# Patient Record
Sex: Female | Born: 1940 | Race: White | Hispanic: No | Smoking: Never smoker
Health system: Southern US, Community
[De-identification: ages and names within clinical notes are randomized; demographics above are authoritative.]

## PROBLEM LIST (undated history)

## (undated) DIAGNOSIS — Z90710 Acquired absence of both cervix and uterus: Secondary | ICD-10-CM

## (undated) HISTORY — PX: OTHER SURGICAL HISTORY: SHX169

## (undated) HISTORY — DX: Acquired absence of both cervix and uterus: Z90.710

---

## 2016-11-24 DIAGNOSIS — J029 Acute pharyngitis, unspecified: Secondary | ICD-10-CM | POA: Diagnosis not present

## 2016-11-24 DIAGNOSIS — R51 Headache: Secondary | ICD-10-CM | POA: Diagnosis not present

## 2016-11-24 DIAGNOSIS — R05 Cough: Secondary | ICD-10-CM | POA: Diagnosis not present

## 2016-11-24 DIAGNOSIS — R0981 Nasal congestion: Secondary | ICD-10-CM | POA: Diagnosis not present

## 2016-11-24 DIAGNOSIS — J069 Acute upper respiratory infection, unspecified: Secondary | ICD-10-CM | POA: Diagnosis not present

## 2016-11-24 DIAGNOSIS — Z6824 Body mass index (BMI) 24.0-24.9, adult: Secondary | ICD-10-CM | POA: Diagnosis not present

## 2016-12-28 DIAGNOSIS — I10 Essential (primary) hypertension: Secondary | ICD-10-CM | POA: Diagnosis not present

## 2016-12-28 DIAGNOSIS — E039 Hypothyroidism, unspecified: Secondary | ICD-10-CM | POA: Diagnosis not present

## 2017-02-11 DIAGNOSIS — M545 Low back pain: Secondary | ICD-10-CM | POA: Diagnosis not present

## 2017-02-11 DIAGNOSIS — Z6824 Body mass index (BMI) 24.0-24.9, adult: Secondary | ICD-10-CM | POA: Diagnosis not present

## 2017-02-11 DIAGNOSIS — K5900 Constipation, unspecified: Secondary | ICD-10-CM | POA: Diagnosis not present

## 2017-02-11 DIAGNOSIS — E039 Hypothyroidism, unspecified: Secondary | ICD-10-CM | POA: Diagnosis not present

## 2017-03-19 ENCOUNTER — Other Ambulatory Visit: Payer: Self-pay | Admitting: Family Medicine

## 2017-03-19 DIAGNOSIS — Z6824 Body mass index (BMI) 24.0-24.9, adult: Secondary | ICD-10-CM | POA: Diagnosis not present

## 2017-03-19 DIAGNOSIS — Z1231 Encounter for screening mammogram for malignant neoplasm of breast: Secondary | ICD-10-CM

## 2017-03-19 DIAGNOSIS — M25561 Pain in right knee: Secondary | ICD-10-CM | POA: Diagnosis not present

## 2017-03-19 DIAGNOSIS — M545 Low back pain: Secondary | ICD-10-CM | POA: Diagnosis not present

## 2017-03-19 DIAGNOSIS — M25562 Pain in left knee: Secondary | ICD-10-CM | POA: Diagnosis not present

## 2017-03-25 DIAGNOSIS — T63481A Toxic effect of venom of other arthropod, accidental (unintentional), initial encounter: Secondary | ICD-10-CM | POA: Diagnosis not present

## 2017-04-08 ENCOUNTER — Ambulatory Visit: Payer: Self-pay

## 2017-04-08 ENCOUNTER — Ambulatory Visit
Admission: RE | Admit: 2017-04-08 | Discharge: 2017-04-08 | Disposition: A | Discharge status: Home / Self Care | Payer: PPO | Source: Ambulatory Visit | Attending: Family Medicine | Admitting: Family Medicine

## 2017-04-08 DIAGNOSIS — Z1231 Encounter for screening mammogram for malignant neoplasm of breast: Secondary | ICD-10-CM

## 2017-04-09 ENCOUNTER — Ambulatory Visit: Payer: Self-pay

## 2017-06-02 DIAGNOSIS — M1712 Unilateral primary osteoarthritis, left knee: Secondary | ICD-10-CM | POA: Diagnosis not present

## 2017-06-09 DIAGNOSIS — M1712 Unilateral primary osteoarthritis, left knee: Secondary | ICD-10-CM | POA: Diagnosis not present

## 2017-06-16 DIAGNOSIS — M1712 Unilateral primary osteoarthritis, left knee: Secondary | ICD-10-CM | POA: Diagnosis not present

## 2017-07-27 DIAGNOSIS — Z961 Presence of intraocular lens: Secondary | ICD-10-CM | POA: Diagnosis not present

## 2017-07-27 DIAGNOSIS — H52203 Unspecified astigmatism, bilateral: Secondary | ICD-10-CM | POA: Diagnosis not present

## 2017-07-27 DIAGNOSIS — H35373 Puckering of macula, bilateral: Secondary | ICD-10-CM | POA: Diagnosis not present

## 2017-08-10 DIAGNOSIS — E785 Hyperlipidemia, unspecified: Secondary | ICD-10-CM | POA: Diagnosis not present

## 2017-08-12 DIAGNOSIS — E782 Mixed hyperlipidemia: Secondary | ICD-10-CM | POA: Diagnosis not present

## 2017-08-12 DIAGNOSIS — Z1211 Encounter for screening for malignant neoplasm of colon: Secondary | ICD-10-CM | POA: Diagnosis not present

## 2017-08-12 DIAGNOSIS — E039 Hypothyroidism, unspecified: Secondary | ICD-10-CM | POA: Diagnosis not present

## 2017-08-12 DIAGNOSIS — Z23 Encounter for immunization: Secondary | ICD-10-CM | POA: Diagnosis not present

## 2017-08-12 DIAGNOSIS — Z6824 Body mass index (BMI) 24.0-24.9, adult: Secondary | ICD-10-CM | POA: Diagnosis not present

## 2017-08-12 DIAGNOSIS — K5900 Constipation, unspecified: Secondary | ICD-10-CM | POA: Diagnosis not present

## 2017-08-12 DIAGNOSIS — Z0000 Encounter for general adult medical examination without abnormal findings: Secondary | ICD-10-CM | POA: Diagnosis not present

## 2017-12-07 DIAGNOSIS — E782 Mixed hyperlipidemia: Secondary | ICD-10-CM | POA: Diagnosis not present

## 2017-12-07 DIAGNOSIS — E039 Hypothyroidism, unspecified: Secondary | ICD-10-CM | POA: Diagnosis not present

## 2017-12-07 DIAGNOSIS — E559 Vitamin D deficiency, unspecified: Secondary | ICD-10-CM | POA: Diagnosis not present

## 2017-12-09 DIAGNOSIS — K5900 Constipation, unspecified: Secondary | ICD-10-CM | POA: Diagnosis not present

## 2017-12-09 DIAGNOSIS — E039 Hypothyroidism, unspecified: Secondary | ICD-10-CM | POA: Diagnosis not present

## 2017-12-09 DIAGNOSIS — E559 Vitamin D deficiency, unspecified: Secondary | ICD-10-CM | POA: Diagnosis not present

## 2017-12-09 DIAGNOSIS — E782 Mixed hyperlipidemia: Secondary | ICD-10-CM | POA: Diagnosis not present

## 2018-01-31 DIAGNOSIS — M545 Low back pain: Secondary | ICD-10-CM | POA: Diagnosis not present

## 2018-01-31 DIAGNOSIS — M25562 Pain in left knee: Secondary | ICD-10-CM | POA: Diagnosis not present

## 2018-01-31 DIAGNOSIS — M1711 Unilateral primary osteoarthritis, right knee: Secondary | ICD-10-CM | POA: Diagnosis not present

## 2018-02-14 DIAGNOSIS — M170 Bilateral primary osteoarthritis of knee: Secondary | ICD-10-CM | POA: Diagnosis not present

## 2018-02-21 DIAGNOSIS — M170 Bilateral primary osteoarthritis of knee: Secondary | ICD-10-CM | POA: Diagnosis not present

## 2018-02-28 DIAGNOSIS — M170 Bilateral primary osteoarthritis of knee: Secondary | ICD-10-CM | POA: Diagnosis not present

## 2018-03-08 ENCOUNTER — Other Ambulatory Visit: Payer: Self-pay | Admitting: Family Medicine

## 2018-03-08 DIAGNOSIS — Z1231 Encounter for screening mammogram for malignant neoplasm of breast: Secondary | ICD-10-CM

## 2018-04-18 ENCOUNTER — Ambulatory Visit
Admission: RE | Admit: 2018-04-18 | Discharge: 2018-04-18 | Disposition: A | Discharge status: Home / Self Care | Payer: PPO | Source: Ambulatory Visit | Attending: Family Medicine | Admitting: Family Medicine

## 2018-04-18 DIAGNOSIS — Z1231 Encounter for screening mammogram for malignant neoplasm of breast: Secondary | ICD-10-CM

## 2018-04-22 DIAGNOSIS — M542 Cervicalgia: Secondary | ICD-10-CM | POA: Diagnosis not present

## 2018-06-07 DIAGNOSIS — E039 Hypothyroidism, unspecified: Secondary | ICD-10-CM | POA: Diagnosis not present

## 2018-06-07 DIAGNOSIS — E785 Hyperlipidemia, unspecified: Secondary | ICD-10-CM | POA: Diagnosis not present

## 2018-06-09 DIAGNOSIS — Z6822 Body mass index (BMI) 22.0-22.9, adult: Secondary | ICD-10-CM | POA: Diagnosis not present

## 2018-06-09 DIAGNOSIS — E039 Hypothyroidism, unspecified: Secondary | ICD-10-CM | POA: Diagnosis not present

## 2018-06-09 DIAGNOSIS — E782 Mixed hyperlipidemia: Secondary | ICD-10-CM | POA: Diagnosis not present

## 2018-08-15 DIAGNOSIS — Z6824 Body mass index (BMI) 24.0-24.9, adult: Secondary | ICD-10-CM | POA: Diagnosis not present

## 2018-08-15 DIAGNOSIS — Z0000 Encounter for general adult medical examination without abnormal findings: Secondary | ICD-10-CM | POA: Diagnosis not present

## 2018-08-15 DIAGNOSIS — Z23 Encounter for immunization: Secondary | ICD-10-CM | POA: Diagnosis not present

## 2019-02-13 DIAGNOSIS — E559 Vitamin D deficiency, unspecified: Secondary | ICD-10-CM | POA: Diagnosis not present

## 2019-02-13 DIAGNOSIS — E119 Type 2 diabetes mellitus without complications: Secondary | ICD-10-CM | POA: Diagnosis not present

## 2019-02-13 DIAGNOSIS — K5900 Constipation, unspecified: Secondary | ICD-10-CM | POA: Diagnosis not present

## 2019-02-13 DIAGNOSIS — E039 Hypothyroidism, unspecified: Secondary | ICD-10-CM | POA: Diagnosis not present

## 2019-02-13 DIAGNOSIS — E782 Mixed hyperlipidemia: Secondary | ICD-10-CM | POA: Diagnosis not present

## 2019-03-17 DIAGNOSIS — M170 Bilateral primary osteoarthritis of knee: Secondary | ICD-10-CM | POA: Diagnosis not present

## 2019-03-24 DIAGNOSIS — M170 Bilateral primary osteoarthritis of knee: Secondary | ICD-10-CM | POA: Diagnosis not present

## 2019-03-31 DIAGNOSIS — M170 Bilateral primary osteoarthritis of knee: Secondary | ICD-10-CM | POA: Diagnosis not present

## 2019-05-30 DIAGNOSIS — E039 Hypothyroidism, unspecified: Secondary | ICD-10-CM | POA: Diagnosis not present

## 2019-05-30 DIAGNOSIS — E782 Mixed hyperlipidemia: Secondary | ICD-10-CM | POA: Diagnosis not present

## 2019-05-30 DIAGNOSIS — E559 Vitamin D deficiency, unspecified: Secondary | ICD-10-CM | POA: Diagnosis not present

## 2019-05-31 ENCOUNTER — Other Ambulatory Visit: Payer: Self-pay

## 2019-06-05 DIAGNOSIS — E782 Mixed hyperlipidemia: Secondary | ICD-10-CM | POA: Diagnosis not present

## 2019-06-05 DIAGNOSIS — E039 Hypothyroidism, unspecified: Secondary | ICD-10-CM | POA: Diagnosis not present

## 2019-06-05 DIAGNOSIS — E559 Vitamin D deficiency, unspecified: Secondary | ICD-10-CM | POA: Diagnosis not present

## 2019-07-25 DIAGNOSIS — Z23 Encounter for immunization: Secondary | ICD-10-CM | POA: Diagnosis not present

## 2019-08-10 ENCOUNTER — Other Ambulatory Visit: Payer: Self-pay

## 2019-08-10 ENCOUNTER — Ambulatory Visit (INDEPENDENT_AMBULATORY_CARE_PROVIDER_SITE_OTHER): Payer: PPO

## 2019-08-10 ENCOUNTER — Encounter: Payer: Self-pay | Admitting: Podiatry

## 2019-08-10 ENCOUNTER — Ambulatory Visit: Payer: PPO | Admitting: Podiatry

## 2019-08-10 DIAGNOSIS — M2011 Hallux valgus (acquired), right foot: Secondary | ICD-10-CM | POA: Diagnosis not present

## 2019-08-10 DIAGNOSIS — M2012 Hallux valgus (acquired), left foot: Secondary | ICD-10-CM | POA: Diagnosis not present

## 2019-08-10 DIAGNOSIS — M2042 Other hammer toe(s) (acquired), left foot: Secondary | ICD-10-CM

## 2019-08-10 DIAGNOSIS — M2041 Other hammer toe(s) (acquired), right foot: Secondary | ICD-10-CM

## 2019-08-10 DIAGNOSIS — M2040 Other hammer toe(s) (acquired), unspecified foot: Secondary | ICD-10-CM | POA: Diagnosis not present

## 2019-08-10 NOTE — Patient Instructions (Signed)
Bunion  A bunion is a bump on the base of the big toe that forms when the bones of the big toe joint move out of position. Bunions may be small at first, but they often get larger over time. They can make walking painful. What are the causes? A bunion may be caused by:  Wearing narrow or pointed shoes that force the big toe to press against the other toes.  Abnormal foot development that causes the foot to roll inward (pronate).  Changes in the foot that are caused by certain diseases, such as rheumatoid arthritis or polio.  A foot injury. What increases the risk? The following factors may make you more likely to develop this condition:  Wearing shoes that squeeze the toes together.  Having certain diseases, such as: ? Rheumatoid arthritis. ? Polio. ? Cerebral palsy.  Having family members who have bunions.  Being born with a foot deformity, such as flat feet or low arches.  Doing activities that put a lot of pressure on the feet, such as ballet dancing. What are the signs or symptoms? The main symptom of a bunion is a noticeable bump on the big toe. Other symptoms may include:  Pain.  Swelling around the big toe.  Redness and inflammation.  Thick or hardened skin on the big toe or between the toes.  Stiffness or loss of motion in the big toe.  Trouble with walking. How is this diagnosed? A bunion may be diagnosed based on your symptoms, medical history, and activities. You may have tests, such as:  X-rays. These allow your health care provider to check the position of the bones in your foot and look for damage to your joint. They also help your health care provider determine the severity of your bunion and the best way to treat it.  Joint aspiration. In this test, a sample of fluid is removed from the toe joint. This test may be done if you are in a lot of pain. It helps rule out diseases that cause painful swelling of the joints, such as arthritis. How is this  treated? Treatment depends on the severity of your symptoms. The goal of treatment is to relieve symptoms and prevent the bunion from getting worse. Your health care provider may recommend:  Wearing shoes that have a wide toe box.  Using bunion pads to cushion the affected area.  Taping your toes together to keep them in a normal position.  Placing a device inside your shoe (orthotics) to help reduce pressure on your toe joint.  Taking medicine to ease pain, inflammation, and swelling.  Applying heat or ice to the affected area.  Doing stretching exercises.  Surgery to remove scar tissue and move the toes back into their normal position. This treatment is rare. Follow these instructions at home: Managing pain, stiffness, and swelling   If directed, put ice on the painful area: ? Put ice in a plastic bag. ? Place a towel between your skin and the bag. ? Leave the ice on for 20 minutes, 2-3 times a day. Activity   If directed, apply heat to the affected area before you exercise. Use the heat source that your health care provider recommends, such as a moist heat pack or a heating pad. ? Place a towel between your skin and the heat source. ? Leave the heat on for 20-30 minutes. ? Remove the heat if your skin turns bright red. This is especially important if you are unable to feel pain,   heat, or cold. You may have a greater risk of getting burned.  Do exercises as told by your health care provider. General instructions  Support your toe joint with proper footwear, shoe padding, or taping as told by your health care provider.  Take over-the-counter and prescription medicines only as told by your health care provider.  Keep all follow-up visits as told by your health care provider. This is important. Contact a health care provider if your symptoms:  Get worse.  Do not improve in 2 weeks. Get help right away if you have:  Severe pain and trouble with walking. Summary  A  bunion is a bump on the base of the big toe that forms when the bones of the big toe joint move out of position.  Bunions can make walking painful.  Treatment depends on the severity of your symptoms.  Support your toe joint with proper footwear, shoe padding, or taping as told by your health care provider. This information is not intended to replace advice given to you by your health care provider. Make sure you discuss any questions you have with your health care provider. Document Released: 10/19/2005 Document Revised: 04/25/2018 Document Reviewed: 03/01/2018 Elsevier Patient Education  Hurley Toe  Hammer toe is a change in the shape (a deformity) of your toe. The deformity causes the middle joint of your toe to stay bent. This causes pain, especially when you are wearing shoes. Hammer toe starts gradually. At first, the toe can be straightened. Gradually over time, the deformity becomes stiff and permanent. Early treatments to keep the toe straight may relieve pain. As the deformity becomes stiff and permanent, surgery may be needed to straighten the toe. What are the causes? Hammer toe is caused by abnormal bending of the toe joint that is closest to your foot. It happens gradually over time. This pulls on the muscles and connections (tendons) of the toe joint, making them weak and stiff. It is often related to wearing shoes that are too short or narrow and do not let your toes straighten. What increases the risk? You may be at greater risk for hammer toe if you:  Are female.  Are older.  Wear shoes that are too small.  Wear high-heeled shoes that pinch your toes.  Are a Engineer, mining.  Have a second toe that is longer than your big toe (first toe).  Injure your foot or toe.  Have arthritis.  Have a family history of hammer toe.  Have a nerve or muscle disorder. What are the signs or symptoms? The main symptoms of this condition are pain and  deformity of the toe. The pain is worse when wearing shoes, walking, or running. Other symptoms may include:  Corns or calluses over the bent part of the toe or between the toes.  Redness and a burning feeling on the toe.  An open sore that forms on the top of the toe.  Not being able to straighten the toe. How is this diagnosed? This condition is diagnosed based on your symptoms and a physical exam. During the exam, your health care provider will try to straighten your toe to see how stiff the deformity is. You may also have tests, such as:  A blood test to check for rheumatoid arthritis.  An X-ray to show how severe the deformity is. How is this treated? Treatment for this condition will depend on how stiff the deformity is. Surgery is often needed. However,  sometimes a hammer toe can be straightened without surgery. Treatments that do not involve surgery include:  Taping the toe into a straightened position.  Using pads and cushions to protect the toe (orthotics).  Wearing shoes that provide enough room for the toes.  Doing toe-stretching exercises at home.  Taking an NSAID to reduce pain and swelling. If these treatments do not help or the toe cannot be straightened, surgery is the next option. The most common surgeries used to straighten a hammer toe include:  Arthroplasty. In this procedure, part of the joint is removed, and that allows the toe to straighten.  Fusion. In this procedure, cartilage between the two bones of the joint is taken out and the bones are fused together into one longer bone.  Implantation. In this procedure, part of the bone is removed and replaced with an implant to let the toe move again.  Flexor tendon transfer. In this procedure, the tendons that curl the toes down (flexor tendons) are repositioned. Follow these instructions at home:  Take over-the-counter and prescription medicines only as told by your health care provider.  Do toe  straightening and stretching exercises as told by your health care provider.  Keep all follow-up visits as told by your health care provider. This is important. How is this prevented?  Wear shoes that give your toes enough room and do not cause pain.  Do not wear high-heeled shoes. Contact a health care provider if:  Your pain gets worse.  Your toe becomes red or swollen.  You develop an open sore on your toe. This information is not intended to replace advice given to you by your health care provider. Make sure you discuss any questions you have with your health care provider. Document Released: 10/16/2000 Document Revised: 10/01/2017 Document Reviewed: 02/12/2016 Elsevier Patient Education  2020 [employer].

## 2019-08-10 NOTE — Progress Notes (Signed)
   Subjective:    Patient ID: Kristi Bender, female    DOB: September 21, 1941, 78 y.o.   MRN: [id number]  HPI    Review of Systems  All other systems reviewed and are negative.      Objective:   Physical Exam        Assessment & Plan:

## 2019-08-11 NOTE — Progress Notes (Signed)
Subjective:   Patient ID: Kristi Bender, female   DOB: 78 y.o.   MRN: [id number]   HPI Patient presents stating that she has a hammertoe deformity second toe left over right and also has a bunion deformity which is becoming tender.  States that she is been thinking about surgery and the toe was becoming more bothersome for her and making it more difficult to wear shoes and she knows she probably needs to get some done.  Patient does not smoke likes to be active   Review of Systems  All other systems reviewed and are negative.       Objective:  Physical Exam Vitals signs and nursing note reviewed.  Constitutional:      Appearance: She is well-developed.  Pulmonary:     Effort: Pulmonary effort is normal.  Musculoskeletal: Normal range of motion.  Skin:    General: Skin is warm.  Neurological:     Mental Status: She is alert.     Neurovascular status intact muscle strength found to be adequate range of motion found to be within normal limits patient is noted to have hyperostosis medial aspect first metatarsal head left with redness around it and is found to have elevated second digit left foot with rigid contracture.  Patient is got inflammation of the second MPJ of a mild nature but mostly within the toe itself and it has become rigid over the last several years.  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  I have recommended long-term distal osteotomy digital fusion and explained procedure to her and all complications.  She is willing to accept getting this done wants to look at her schedule and will call to schedule and I will see her before to go over greater detail  X-ray indicates there is significant structural bunion deformity  left over right with mild osteoporosis and rigid contracture digit to left over right

## 2019-08-16 ENCOUNTER — Telehealth: Payer: Self-pay | Admitting: Podiatry

## 2019-08-16 NOTE — Telephone Encounter (Signed)
I'm calling to schedule my surgery with Dr. Paulla Dolly for my hammertoe and bunion.

## 2019-08-16 NOTE — Telephone Encounter (Signed)
Left voicemail to call and schedule a surgical consult with Dr. Paulla Dolly before scheduling her surgery. I said we could put her down tentatively but the spot would not be held. I told her I was looking at Tuesday 09/12/2019 or any Tuesday after.

## 2019-08-17 DIAGNOSIS — Z6824 Body mass index (BMI) 24.0-24.9, adult: Secondary | ICD-10-CM | POA: Diagnosis not present

## 2019-08-17 DIAGNOSIS — Z0000 Encounter for general adult medical examination without abnormal findings: Secondary | ICD-10-CM | POA: Diagnosis not present

## 2019-08-17 DIAGNOSIS — Z23 Encounter for immunization: Secondary | ICD-10-CM | POA: Diagnosis not present

## 2019-08-17 DIAGNOSIS — M810 Age-related osteoporosis without current pathological fracture: Secondary | ICD-10-CM | POA: Diagnosis not present

## 2019-08-17 DIAGNOSIS — E782 Mixed hyperlipidemia: Secondary | ICD-10-CM | POA: Diagnosis not present

## 2019-08-17 DIAGNOSIS — E559 Vitamin D deficiency, unspecified: Secondary | ICD-10-CM | POA: Diagnosis not present

## 2019-08-22 DIAGNOSIS — Z23 Encounter for immunization: Secondary | ICD-10-CM | POA: Diagnosis not present

## 2019-08-25 ENCOUNTER — Telehealth: Payer: Self-pay | Admitting: Podiatry

## 2019-08-25 NOTE — Telephone Encounter (Signed)
Called pt to let her know I would have her disc of x-rays ready and up front at check for her on Monday morning. Told her she could come get them at her convenience. Asked if she was still planning on having surgery with Dr. Paulla Dolly or holding off. Pt stated she is holding off of surgery right now.

## 2019-09-08 DIAGNOSIS — H35373 Puckering of macula, bilateral: Secondary | ICD-10-CM | POA: Diagnosis not present

## 2019-10-04 DIAGNOSIS — M21619 Bunion of unspecified foot: Secondary | ICD-10-CM | POA: Diagnosis not present

## 2019-10-04 DIAGNOSIS — M2042 Other hammer toe(s) (acquired), left foot: Secondary | ICD-10-CM | POA: Diagnosis not present

## 2019-11-06 DIAGNOSIS — M170 Bilateral primary osteoarthritis of knee: Secondary | ICD-10-CM | POA: Diagnosis not present

## 2019-11-13 ENCOUNTER — Other Ambulatory Visit: Payer: Self-pay | Admitting: Orthopedic Surgery

## 2019-11-13 ENCOUNTER — Telehealth: Payer: Self-pay | Admitting: Nurse Practitioner

## 2019-11-13 DIAGNOSIS — G8929 Other chronic pain: Secondary | ICD-10-CM

## 2019-11-13 DIAGNOSIS — M545 Low back pain, unspecified: Secondary | ICD-10-CM

## 2019-11-13 DIAGNOSIS — M170 Bilateral primary osteoarthritis of knee: Secondary | ICD-10-CM | POA: Diagnosis not present

## 2019-11-13 NOTE — Telephone Encounter (Signed)
Phone call to patient to verify medication list and allergies for myelogram procedure. Pt aware she will not need to hold any medications for this procedure. Pre and post procedure instructions reviewed with pt. Pt verbalized understanding. 

## 2019-11-20 DIAGNOSIS — M170 Bilateral primary osteoarthritis of knee: Secondary | ICD-10-CM | POA: Diagnosis not present

## 2019-11-22 ENCOUNTER — Other Ambulatory Visit: Payer: Self-pay

## 2019-11-22 ENCOUNTER — Ambulatory Visit
Admission: RE | Admit: 2019-11-22 | Discharge: 2019-11-22 | Disposition: A | Discharge status: Home / Self Care | Payer: PPO | Source: Ambulatory Visit | Attending: Orthopedic Surgery | Admitting: Orthopedic Surgery

## 2019-11-22 DIAGNOSIS — G8929 Other chronic pain: Secondary | ICD-10-CM

## 2019-11-22 DIAGNOSIS — M549 Dorsalgia, unspecified: Secondary | ICD-10-CM | POA: Diagnosis not present

## 2019-11-22 DIAGNOSIS — M545 Low back pain, unspecified: Secondary | ICD-10-CM

## 2019-11-22 DIAGNOSIS — M48061 Spinal stenosis, lumbar region without neurogenic claudication: Secondary | ICD-10-CM | POA: Diagnosis not present

## 2019-11-22 MED ORDER — DIAZEPAM 5 MG PO TABS
5.0000 mg | ORAL_TABLET | Freq: Once | ORAL | Status: AC
  Administered 2019-11-22: 5 mg via ORAL

## 2019-11-22 MED ORDER — ONDANSETRON HCL 4 MG/2ML IJ SOLN: 4.0000 mg | Freq: Four times a day (QID) | INTRAMUSCULAR | Status: DC | PRN

## 2019-11-22 MED ORDER — IOPAMIDOL (ISOVUE-M 200) INJECTION 41%
18.0000 mL | Freq: Once | INTRAMUSCULAR | Status: AC
  Administered 2019-11-22: 18 mL via INTRATHECAL

## 2019-11-22 NOTE — Discharge Instructions (Signed)

## 2019-11-27 ENCOUNTER — Ambulatory Visit: Payer: PPO | Attending: Internal Medicine

## 2019-11-27 DIAGNOSIS — Z23 Encounter for immunization: Secondary | ICD-10-CM | POA: Insufficient documentation

## 2019-11-27 NOTE — Progress Notes (Signed)
   Covid-19 Vaccination Clinic  Name:  MAKYNLEIGH HOUX    MRN: [id number] DOB: February 28, 1941  11/27/2019  Ms. Haughn was observed post Covid-19 immunization for 15 minutes without incidence. She was provided with Vaccine Information Sheet and instruction to access the V-Safe system.   Ms. Mustoe was instructed to call 911 with any severe reactions post vaccine: Marland Kitchen Difficulty breathing  . Swelling of your face and throat  . A fast heartbeat  . A bad rash all over your body  . Dizziness and weakness    Immunizations Administered    Name Date Dose VIS Date Route   Pfizer COVID-19 Vaccine 11/27/2019 11:39 AM 0.3 mL 10/13/2019 Intramuscular   Manufacturer: [place]   Lot: [id number]   [place]: [id number]

## 2019-12-15 ENCOUNTER — Ambulatory Visit: Payer: PPO | Attending: Internal Medicine

## 2019-12-15 DIAGNOSIS — Z23 Encounter for immunization: Secondary | ICD-10-CM | POA: Insufficient documentation

## 2019-12-15 NOTE — Progress Notes (Signed)
   Covid-19 Vaccination Clinic  Name:  Kristi Bender    MRN: [id number] DOB: 1941-03-03  12/15/2019  [name] was observed post Covid-19 immunization for 15 minutes without incidence. She was provided with Vaccine Information Sheet and instruction to access the V-Safe system.   [name] was instructed to call 911 with any severe reactions post vaccine: Marland Kitchen Difficulty breathing  . Swelling of your face and throat  . A fast heartbeat  . A bad rash all over your body  . Dizziness and weakness    Immunizations Administered    Name Date Dose VIS Date Route   Pfizer COVID-19 Vaccine 12/15/2019 12:19 PM 0.3 mL 10/13/2019 Intramuscular   Manufacturer: [place]   Lot: [id number]   [place]: [id number]

## 2019-12-18 ENCOUNTER — Ambulatory Visit: Payer: PPO

## 2019-12-18 DIAGNOSIS — M5416 Radiculopathy, lumbar region: Secondary | ICD-10-CM | POA: Diagnosis not present

## 2019-12-18 DIAGNOSIS — M545 Low back pain: Secondary | ICD-10-CM | POA: Diagnosis not present

## 2020-01-15 DIAGNOSIS — M5416 Radiculopathy, lumbar region: Secondary | ICD-10-CM | POA: Diagnosis not present

## 2020-01-15 DIAGNOSIS — M545 Low back pain: Secondary | ICD-10-CM | POA: Diagnosis not present

## 2020-02-09 DIAGNOSIS — S61451A Open bite of right hand, initial encounter: Secondary | ICD-10-CM | POA: Diagnosis not present

## 2020-02-09 DIAGNOSIS — W540XXA Bitten by dog, initial encounter: Secondary | ICD-10-CM | POA: Diagnosis not present

## 2020-02-12 DIAGNOSIS — S61451S Open bite of right hand, sequela: Secondary | ICD-10-CM | POA: Diagnosis not present

## 2020-02-12 DIAGNOSIS — M545 Low back pain: Secondary | ICD-10-CM | POA: Diagnosis not present

## 2020-02-12 DIAGNOSIS — M7071 Other bursitis of hip, right hip: Secondary | ICD-10-CM | POA: Diagnosis not present

## 2020-02-15 DIAGNOSIS — S61451S Open bite of right hand, sequela: Secondary | ICD-10-CM | POA: Diagnosis not present

## 2020-02-16 DIAGNOSIS — S61451S Open bite of right hand, sequela: Secondary | ICD-10-CM | POA: Diagnosis not present

## 2020-02-20 DIAGNOSIS — S61451S Open bite of right hand, sequela: Secondary | ICD-10-CM | POA: Diagnosis not present

## 2020-03-04 DIAGNOSIS — E559 Vitamin D deficiency, unspecified: Secondary | ICD-10-CM | POA: Diagnosis not present

## 2020-03-04 DIAGNOSIS — Z79899 Other long term (current) drug therapy: Secondary | ICD-10-CM | POA: Diagnosis not present

## 2020-03-04 DIAGNOSIS — E039 Hypothyroidism, unspecified: Secondary | ICD-10-CM | POA: Diagnosis not present

## 2020-03-04 DIAGNOSIS — E782 Mixed hyperlipidemia: Secondary | ICD-10-CM | POA: Diagnosis not present

## 2020-03-07 DIAGNOSIS — E782 Mixed hyperlipidemia: Secondary | ICD-10-CM | POA: Diagnosis not present

## 2020-03-07 DIAGNOSIS — E559 Vitamin D deficiency, unspecified: Secondary | ICD-10-CM | POA: Diagnosis not present

## 2020-03-07 DIAGNOSIS — E039 Hypothyroidism, unspecified: Secondary | ICD-10-CM | POA: Diagnosis not present

## 2020-03-07 DIAGNOSIS — K5900 Constipation, unspecified: Secondary | ICD-10-CM | POA: Diagnosis not present

## 2020-05-22 DIAGNOSIS — M545 Low back pain: Secondary | ICD-10-CM | POA: Diagnosis not present

## 2020-05-28 ENCOUNTER — Other Ambulatory Visit: Payer: Self-pay

## 2020-05-30 ENCOUNTER — Other Ambulatory Visit: Payer: Self-pay | Admitting: Family Medicine

## 2020-05-30 DIAGNOSIS — E559 Vitamin D deficiency, unspecified: Secondary | ICD-10-CM

## 2020-08-12 DIAGNOSIS — M170 Bilateral primary osteoarthritis of knee: Secondary | ICD-10-CM | POA: Diagnosis not present

## 2020-08-19 DIAGNOSIS — M170 Bilateral primary osteoarthritis of knee: Secondary | ICD-10-CM | POA: Diagnosis not present

## 2020-08-26 DIAGNOSIS — M170 Bilateral primary osteoarthritis of knee: Secondary | ICD-10-CM | POA: Diagnosis not present

## 2020-08-29 ENCOUNTER — Ambulatory Visit
Admission: RE | Admit: 2020-08-29 | Discharge: 2020-08-29 | Disposition: A | Discharge status: Home / Self Care | Payer: PPO | Source: Ambulatory Visit | Attending: Family Medicine | Admitting: Family Medicine

## 2020-08-29 ENCOUNTER — Other Ambulatory Visit: Payer: Self-pay

## 2020-08-29 DIAGNOSIS — M85851 Other specified disorders of bone density and structure, right thigh: Secondary | ICD-10-CM | POA: Diagnosis not present

## 2020-08-29 DIAGNOSIS — Z780 Asymptomatic menopausal state: Secondary | ICD-10-CM | POA: Diagnosis not present

## 2020-08-29 DIAGNOSIS — E559 Vitamin D deficiency, unspecified: Secondary | ICD-10-CM

## 2020-09-02 DIAGNOSIS — Z0000 Encounter for general adult medical examination without abnormal findings: Secondary | ICD-10-CM | POA: Diagnosis not present

## 2020-09-02 DIAGNOSIS — Z1339 Encounter for screening examination for other mental health and behavioral disorders: Secondary | ICD-10-CM | POA: Diagnosis not present

## 2020-09-02 DIAGNOSIS — M8580 Other specified disorders of bone density and structure, unspecified site: Secondary | ICD-10-CM | POA: Diagnosis not present

## 2020-09-02 DIAGNOSIS — Z1331 Encounter for screening for depression: Secondary | ICD-10-CM | POA: Diagnosis not present

## 2020-09-02 DIAGNOSIS — F40243 Fear of flying: Secondary | ICD-10-CM | POA: Diagnosis not present

## 2020-09-02 DIAGNOSIS — M19011 Primary osteoarthritis, right shoulder: Secondary | ICD-10-CM | POA: Diagnosis not present

## 2020-09-02 DIAGNOSIS — E559 Vitamin D deficiency, unspecified: Secondary | ICD-10-CM | POA: Diagnosis not present

## 2020-09-04 DIAGNOSIS — Z23 Encounter for immunization: Secondary | ICD-10-CM | POA: Diagnosis not present

## 2020-09-09 DIAGNOSIS — H524 Presbyopia: Secondary | ICD-10-CM | POA: Diagnosis not present

## 2020-09-09 DIAGNOSIS — Z961 Presence of intraocular lens: Secondary | ICD-10-CM | POA: Diagnosis not present

## 2020-09-09 DIAGNOSIS — H35373 Puckering of macula, bilateral: Secondary | ICD-10-CM | POA: Diagnosis not present

## 2020-09-19 DIAGNOSIS — D239 Other benign neoplasm of skin, unspecified: Secondary | ICD-10-CM | POA: Diagnosis not present

## 2020-09-19 DIAGNOSIS — M19011 Primary osteoarthritis, right shoulder: Secondary | ICD-10-CM | POA: Diagnosis not present

## 2020-11-15 DIAGNOSIS — M1712 Unilateral primary osteoarthritis, left knee: Secondary | ICD-10-CM | POA: Diagnosis not present

## 2021-02-04 IMAGING — CT CT L SPINE W/ CM
1 of 7 series · 5 of 14 positions shown, 7 images · non-contrast
Comparison: No none ne

CLINICAL DATA: Spondylosis without myelopathy. Back pain and leg
pain with sudden weakness. Patient unable to tolerate MRI because of
claustrophobia.
TECHNIQUE: Contiguous axial images were obtained through the Lumbar spine after
the intrathecal infusion of infusion. Coronal and sagittal
reconstructions were obtained of the axial image sets.

[Series 3: l spine soft · axial · 0.29mm/px · z∈[-240,-96]mm · 5 of 73 slices shown, 7 images]
[im 13/73  soft-tissue]
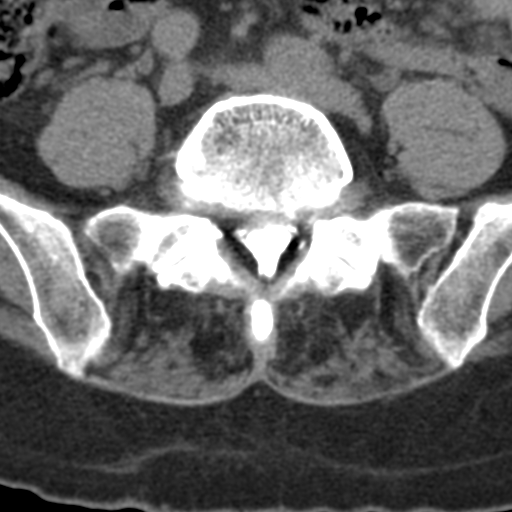
[im 13/73  bone]
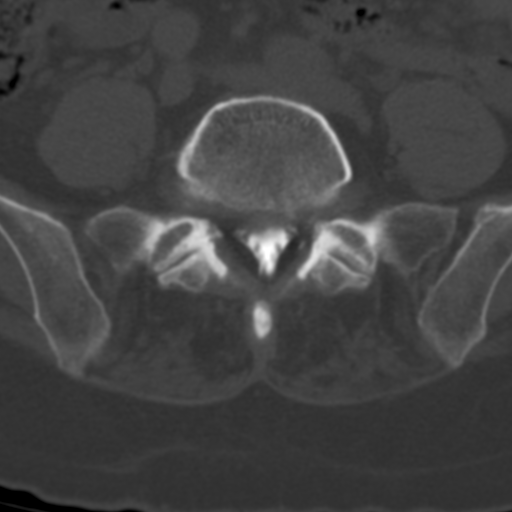
[im 25/73  bone]
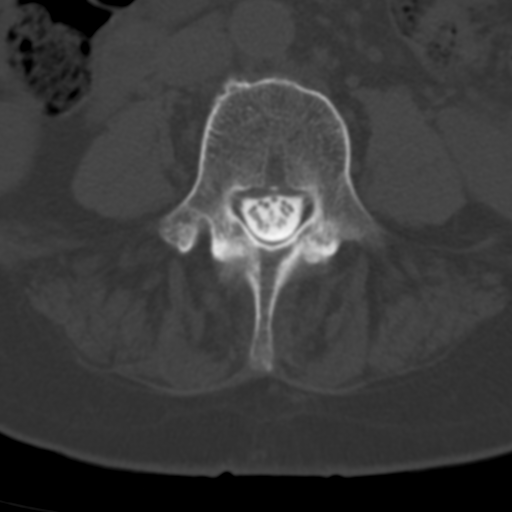
[im 37/73  bone]
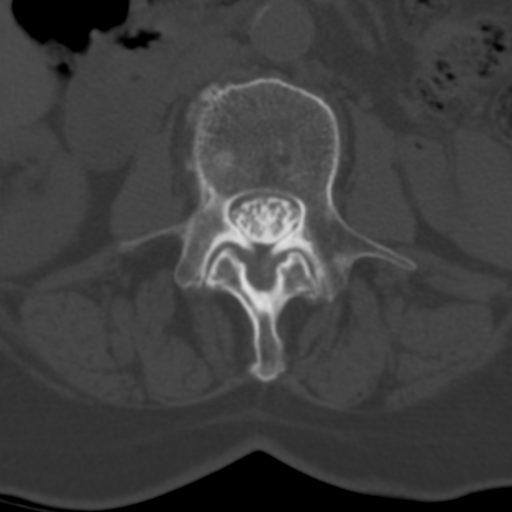
[im 49/73  bone]
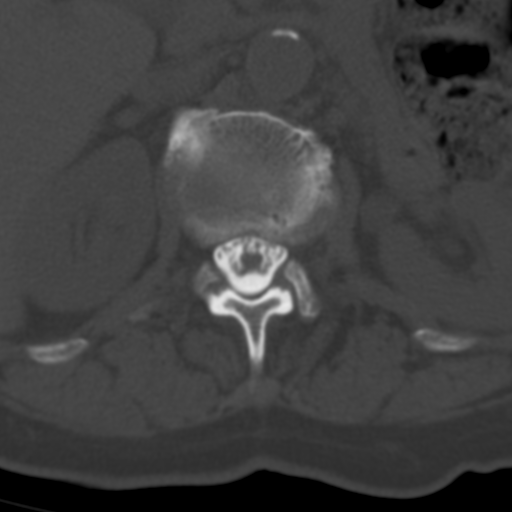
[im 61/73  soft-tissue]
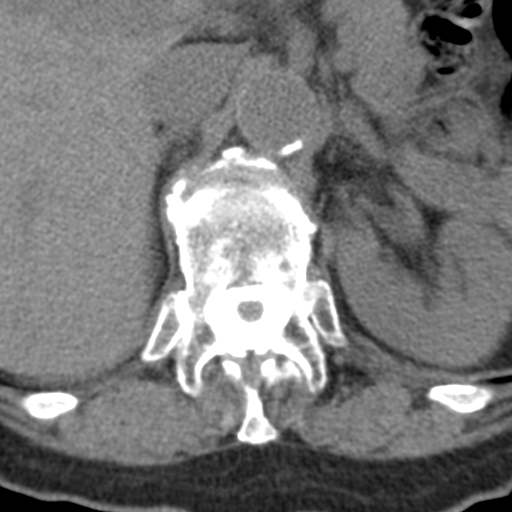
[im 61/73  bone]
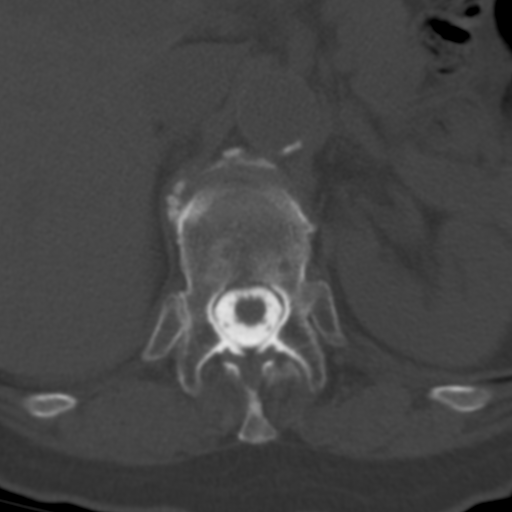

[5 of 14 positions shown; findings below may reference images not displayed]

EXAM:
LUMBAR MYELOGRAM

FLUOROSCOPY TIME:  1 minutes 8 seconds. 158.84 micro gray meter
squared

PROCEDURE:
After thorough discussion of risks and benefits of the procedure
including bleeding, infection, injury to nerves, blood vessels,
adjacent structures as well as headache and CSF leak, written and
oral informed consent was obtained. Consent was obtained by Dr. Geovani
Messini. Time out form was completed.

Patient was positioned prone on the fluoroscopy table. Local
anesthesia was provided with 1% lidocaine without epinephrine after
prepped and draped in the usual sterile fashion. Puncture was
performed at L3-4 using a 3 1/2 inch 22-gauge spinal needle via left
paramedian approach. Using a single pass through the dura, the
needle was placed within the thecal sac, with return of clear CSF.
15 mL of Isovue O-HTT was injected into the thecal sac, with normal
opacification of the nerve roots and cauda equina consistent with
free flow within the subarachnoid space.

I personally performed the lumbar puncture and administered the
intrathecal contrast. I also personally performed acquisition of the
myelogram images.
FINDINGS: LUMBAR MYELOGRAM FINDINGS:

Minimal thoracolumbar curvature convex to the left and lower lumbar
curvature convex to the right. No antero or retrolisthesis. No
abnormal motion with flexion extension. Disc degeneration with loss
of disc height and small anterior extradural defects from L1-2
through L4-5. This is more prominent at the L3-4 level. No evidence
of neural compressive stenosis at myelography.

CT LUMBAR MYELOGRAM FINDINGS:

T11-12: Disc degeneration with vacuum phenomenon. Minimal central
disc bulge. No compressive stenosis.

T12-L1: Minimal disc bulge.  No stenosis.

L1-2: Desiccation of the disc with vacuum phenomenon. Minimal disc
bulge. No stenosis. Conus tip at upper L2.

L2-3: Disc degeneration of vacuum phenomenon. Mild bulging of the
disc. No compressive stenosis.

L3-4: Disc degeneration and vacuum phenomenon. Mild to moderate
bulging of the disc. Mild facet osteoarthritis. No compressive
stenosis.

L4-5: Chronic disc degeneration with loss of disc height. Vacuum
phenomenon. Endplate osteophytes and mild bulging of the disc. Mild
narrowing of the right lateral recess but no apparent compressive
stenosis.

L5-S1: Chronic disc degeneration with loss of disc height. Vacuum
phenomenon. Endplate osteophytes and mild bulging of the disc. Facet
degeneration and hypertrophy, more on the left with some ligamentous
calcification. Mild stenosis of the lateral recesses and foramina,
left more than right, but no distinct neural compression.

Mild sacroiliac osteoarthritis.
IMPRESSION: Chronic disc degeneration throughout the lumbar region with disc
space narrowing and vacuum phenomenon throughout. Minor endplate
osteophyte formation and disc bulges as outlined above. No apparent
compressive stenosis. Slightly more pronounced but grossly
non-compressive canal narrowing at L3-4, in the right lateral recess
at L4-5, and in the subarticular lateral recesses and foramina at
L5-S1 left more than right.

## 2021-03-06 DIAGNOSIS — R0981 Nasal congestion: Secondary | ICD-10-CM | POA: Diagnosis not present

## 2021-03-06 DIAGNOSIS — G43909 Migraine, unspecified, not intractable, without status migrainosus: Secondary | ICD-10-CM | POA: Diagnosis not present

## 2021-03-06 DIAGNOSIS — G4700 Insomnia, unspecified: Secondary | ICD-10-CM | POA: Diagnosis not present

## 2021-03-06 DIAGNOSIS — F4000 Agoraphobia, unspecified: Secondary | ICD-10-CM | POA: Diagnosis not present

## 2021-03-07 DIAGNOSIS — E039 Hypothyroidism, unspecified: Secondary | ICD-10-CM | POA: Diagnosis not present

## 2021-03-07 DIAGNOSIS — E782 Mixed hyperlipidemia: Secondary | ICD-10-CM | POA: Diagnosis not present

## 2021-03-07 DIAGNOSIS — E559 Vitamin D deficiency, unspecified: Secondary | ICD-10-CM | POA: Diagnosis not present

## 2021-03-18 DIAGNOSIS — L089 Local infection of the skin and subcutaneous tissue, unspecified: Secondary | ICD-10-CM | POA: Diagnosis not present

## 2021-03-18 DIAGNOSIS — W57XXXA Bitten or stung by nonvenomous insect and other nonvenomous arthropods, initial encounter: Secondary | ICD-10-CM | POA: Diagnosis not present

## 2021-03-18 DIAGNOSIS — S60469A Insect bite (nonvenomous) of unspecified finger, initial encounter: Secondary | ICD-10-CM | POA: Diagnosis not present

## 2021-03-18 DIAGNOSIS — T07XXXA Unspecified multiple injuries, initial encounter: Secondary | ICD-10-CM | POA: Diagnosis not present

## 2021-04-02 DIAGNOSIS — Z23 Encounter for immunization: Secondary | ICD-10-CM | POA: Diagnosis not present

## 2021-05-15 DIAGNOSIS — M7541 Impingement syndrome of right shoulder: Secondary | ICD-10-CM | POA: Diagnosis not present

## 2021-05-15 DIAGNOSIS — E559 Vitamin D deficiency, unspecified: Secondary | ICD-10-CM | POA: Diagnosis not present

## 2021-05-15 DIAGNOSIS — M19011 Primary osteoarthritis, right shoulder: Secondary | ICD-10-CM | POA: Diagnosis not present

## 2021-05-15 DIAGNOSIS — E782 Mixed hyperlipidemia: Secondary | ICD-10-CM | POA: Diagnosis not present

## 2021-05-15 DIAGNOSIS — E039 Hypothyroidism, unspecified: Secondary | ICD-10-CM | POA: Diagnosis not present

## 2021-05-26 DIAGNOSIS — M19011 Primary osteoarthritis, right shoulder: Secondary | ICD-10-CM | POA: Diagnosis not present

## 2021-07-10 DIAGNOSIS — M170 Bilateral primary osteoarthritis of knee: Secondary | ICD-10-CM | POA: Diagnosis not present

## 2021-07-10 DIAGNOSIS — Z23 Encounter for immunization: Secondary | ICD-10-CM | POA: Diagnosis not present

## 2021-07-17 DIAGNOSIS — M170 Bilateral primary osteoarthritis of knee: Secondary | ICD-10-CM | POA: Diagnosis not present

## 2021-07-24 DIAGNOSIS — Z23 Encounter for immunization: Secondary | ICD-10-CM | POA: Diagnosis not present

## 2021-07-24 DIAGNOSIS — M170 Bilateral primary osteoarthritis of knee: Secondary | ICD-10-CM | POA: Diagnosis not present

## 2021-08-08 DIAGNOSIS — F411 Generalized anxiety disorder: Secondary | ICD-10-CM | POA: Diagnosis not present

## 2021-08-08 DIAGNOSIS — F331 Major depressive disorder, recurrent, moderate: Secondary | ICD-10-CM | POA: Diagnosis not present

## 2021-08-08 DIAGNOSIS — G4700 Insomnia, unspecified: Secondary | ICD-10-CM | POA: Diagnosis not present

## 2021-09-04 DIAGNOSIS — G4700 Insomnia, unspecified: Secondary | ICD-10-CM | POA: Diagnosis not present

## 2021-09-04 DIAGNOSIS — Z0000 Encounter for general adult medical examination without abnormal findings: Secondary | ICD-10-CM | POA: Diagnosis not present

## 2021-09-04 DIAGNOSIS — F331 Major depressive disorder, recurrent, moderate: Secondary | ICD-10-CM | POA: Diagnosis not present

## 2021-09-04 DIAGNOSIS — Z1339 Encounter for screening examination for other mental health and behavioral disorders: Secondary | ICD-10-CM | POA: Diagnosis not present

## 2021-09-04 DIAGNOSIS — Z1331 Encounter for screening for depression: Secondary | ICD-10-CM | POA: Diagnosis not present

## 2021-09-04 DIAGNOSIS — F411 Generalized anxiety disorder: Secondary | ICD-10-CM | POA: Diagnosis not present

## 2021-10-20 DIAGNOSIS — H524 Presbyopia: Secondary | ICD-10-CM | POA: Diagnosis not present

## 2021-10-20 DIAGNOSIS — H35373 Puckering of macula, bilateral: Secondary | ICD-10-CM | POA: Diagnosis not present

## 2021-11-10 DIAGNOSIS — E559 Vitamin D deficiency, unspecified: Secondary | ICD-10-CM | POA: Diagnosis not present

## 2021-11-10 DIAGNOSIS — E785 Hyperlipidemia, unspecified: Secondary | ICD-10-CM | POA: Diagnosis not present

## 2021-11-10 DIAGNOSIS — M8580 Other specified disorders of bone density and structure, unspecified site: Secondary | ICD-10-CM | POA: Diagnosis not present

## 2021-11-10 DIAGNOSIS — E039 Hypothyroidism, unspecified: Secondary | ICD-10-CM | POA: Diagnosis not present

## 2021-11-12 DIAGNOSIS — Z23 Encounter for immunization: Secondary | ICD-10-CM | POA: Diagnosis not present

## 2021-11-12 DIAGNOSIS — Z7185 Encounter for immunization safety counseling: Secondary | ICD-10-CM | POA: Diagnosis not present

## 2021-11-12 DIAGNOSIS — E559 Vitamin D deficiency, unspecified: Secondary | ICD-10-CM | POA: Diagnosis not present

## 2021-11-12 DIAGNOSIS — E039 Hypothyroidism, unspecified: Secondary | ICD-10-CM | POA: Diagnosis not present

## 2021-11-12 DIAGNOSIS — E782 Mixed hyperlipidemia: Secondary | ICD-10-CM | POA: Diagnosis not present

## 2021-11-12 DIAGNOSIS — E0590 Thyrotoxicosis, unspecified without thyrotoxic crisis or storm: Secondary | ICD-10-CM | POA: Diagnosis not present

## 2021-11-16 DIAGNOSIS — S46011A Strain of muscle(s) and tendon(s) of the rotator cuff of right shoulder, initial encounter: Secondary | ICD-10-CM | POA: Diagnosis not present

## 2021-11-17 DIAGNOSIS — S46011D Strain of muscle(s) and tendon(s) of the rotator cuff of right shoulder, subsequent encounter: Secondary | ICD-10-CM | POA: Diagnosis not present

## 2022-03-26 DIAGNOSIS — K5909 Other constipation: Secondary | ICD-10-CM | POA: Diagnosis not present

## 2022-03-26 DIAGNOSIS — R1032 Left lower quadrant pain: Secondary | ICD-10-CM | POA: Diagnosis not present

## 2022-03-31 DIAGNOSIS — R109 Unspecified abdominal pain: Secondary | ICD-10-CM | POA: Diagnosis not present

## 2022-03-31 DIAGNOSIS — N3000 Acute cystitis without hematuria: Secondary | ICD-10-CM | POA: Diagnosis not present

## 2022-05-12 DIAGNOSIS — E0590 Thyrotoxicosis, unspecified without thyrotoxic crisis or storm: Secondary | ICD-10-CM | POA: Diagnosis not present

## 2022-05-12 DIAGNOSIS — E782 Mixed hyperlipidemia: Secondary | ICD-10-CM | POA: Diagnosis not present

## 2022-05-12 DIAGNOSIS — E559 Vitamin D deficiency, unspecified: Secondary | ICD-10-CM | POA: Diagnosis not present

## 2022-05-18 DIAGNOSIS — F411 Generalized anxiety disorder: Secondary | ICD-10-CM | POA: Diagnosis not present

## 2022-05-18 DIAGNOSIS — E782 Mixed hyperlipidemia: Secondary | ICD-10-CM | POA: Diagnosis not present

## 2022-05-18 DIAGNOSIS — E039 Hypothyroidism, unspecified: Secondary | ICD-10-CM | POA: Diagnosis not present

## 2022-05-18 DIAGNOSIS — E559 Vitamin D deficiency, unspecified: Secondary | ICD-10-CM | POA: Diagnosis not present

## 2022-06-16 DIAGNOSIS — Z23 Encounter for immunization: Secondary | ICD-10-CM | POA: Diagnosis not present

## 2022-07-13 DIAGNOSIS — M170 Bilateral primary osteoarthritis of knee: Secondary | ICD-10-CM | POA: Diagnosis not present

## 2022-07-20 DIAGNOSIS — M170 Bilateral primary osteoarthritis of knee: Secondary | ICD-10-CM | POA: Diagnosis not present

## 2022-07-23 DIAGNOSIS — Z23 Encounter for immunization: Secondary | ICD-10-CM | POA: Diagnosis not present

## 2022-07-27 DIAGNOSIS — M170 Bilateral primary osteoarthritis of knee: Secondary | ICD-10-CM | POA: Diagnosis not present

## 2022-08-21 ENCOUNTER — Ambulatory Visit: Payer: PPO | Admitting: Obstetrics and Gynecology

## 2022-08-24 ENCOUNTER — Encounter: Payer: Self-pay | Admitting: *Deleted

## 2022-09-09 ENCOUNTER — Other Ambulatory Visit: Payer: Self-pay | Admitting: Family Medicine

## 2022-09-09 DIAGNOSIS — Z1331 Encounter for screening for depression: Secondary | ICD-10-CM | POA: Diagnosis not present

## 2022-09-09 DIAGNOSIS — Z0000 Encounter for general adult medical examination without abnormal findings: Secondary | ICD-10-CM | POA: Diagnosis not present

## 2022-09-09 DIAGNOSIS — E559 Vitamin D deficiency, unspecified: Secondary | ICD-10-CM

## 2022-09-09 DIAGNOSIS — M8580 Other specified disorders of bone density and structure, unspecified site: Secondary | ICD-10-CM

## 2022-09-09 DIAGNOSIS — Z1339 Encounter for screening examination for other mental health and behavioral disorders: Secondary | ICD-10-CM | POA: Diagnosis not present

## 2022-11-03 DIAGNOSIS — H35373 Puckering of macula, bilateral: Secondary | ICD-10-CM | POA: Diagnosis not present

## 2022-11-03 DIAGNOSIS — H524 Presbyopia: Secondary | ICD-10-CM | POA: Diagnosis not present

## 2022-11-11 DIAGNOSIS — M545 Low back pain, unspecified: Secondary | ICD-10-CM | POA: Diagnosis not present

## 2022-11-11 DIAGNOSIS — M25551 Pain in right hip: Secondary | ICD-10-CM | POA: Diagnosis not present

## 2023-01-15 ENCOUNTER — Ambulatory Visit
Admission: RE | Admit: 2023-01-15 | Discharge: 2023-01-15 | Disposition: A | Discharge status: Home / Self Care | Payer: PPO | Source: Ambulatory Visit | Attending: Family Medicine | Admitting: Family Medicine

## 2023-01-15 DIAGNOSIS — M85852 Other specified disorders of bone density and structure, left thigh: Secondary | ICD-10-CM | POA: Diagnosis not present

## 2023-01-15 DIAGNOSIS — Z780 Asymptomatic menopausal state: Secondary | ICD-10-CM | POA: Diagnosis not present

## 2023-01-15 DIAGNOSIS — M8580 Other specified disorders of bone density and structure, unspecified site: Secondary | ICD-10-CM

## 2023-01-15 DIAGNOSIS — E559 Vitamin D deficiency, unspecified: Secondary | ICD-10-CM

## 2023-02-15 DIAGNOSIS — E039 Hypothyroidism, unspecified: Secondary | ICD-10-CM | POA: Diagnosis not present

## 2023-02-15 DIAGNOSIS — M8580 Other specified disorders of bone density and structure, unspecified site: Secondary | ICD-10-CM | POA: Diagnosis not present

## 2023-02-15 DIAGNOSIS — E782 Mixed hyperlipidemia: Secondary | ICD-10-CM | POA: Diagnosis not present

## 2023-02-15 DIAGNOSIS — E559 Vitamin D deficiency, unspecified: Secondary | ICD-10-CM | POA: Diagnosis not present

## 2023-03-18 DIAGNOSIS — E039 Hypothyroidism, unspecified: Secondary | ICD-10-CM | POA: Diagnosis not present

## 2023-03-18 DIAGNOSIS — E782 Mixed hyperlipidemia: Secondary | ICD-10-CM | POA: Diagnosis not present

## 2023-03-18 DIAGNOSIS — E559 Vitamin D deficiency, unspecified: Secondary | ICD-10-CM | POA: Diagnosis not present

## 2023-03-22 DIAGNOSIS — E039 Hypothyroidism, unspecified: Secondary | ICD-10-CM | POA: Diagnosis not present

## 2023-03-22 DIAGNOSIS — E782 Mixed hyperlipidemia: Secondary | ICD-10-CM | POA: Diagnosis not present

## 2023-03-22 DIAGNOSIS — K5900 Constipation, unspecified: Secondary | ICD-10-CM | POA: Diagnosis not present

## 2023-03-22 DIAGNOSIS — M8580 Other specified disorders of bone density and structure, unspecified site: Secondary | ICD-10-CM | POA: Diagnosis not present

## 2023-03-22 DIAGNOSIS — E559 Vitamin D deficiency, unspecified: Secondary | ICD-10-CM | POA: Diagnosis not present

## 2023-03-22 DIAGNOSIS — Z23 Encounter for immunization: Secondary | ICD-10-CM | POA: Diagnosis not present

## 2023-04-19 DIAGNOSIS — H903 Sensorineural hearing loss, bilateral: Secondary | ICD-10-CM | POA: Insufficient documentation

## 2023-04-19 NOTE — Progress Notes (Signed)
 The patient is an 82 year old female who is here to discuss hearing concerns.  The patient reports no auditory issues, however, her children have expressed concern about her hearing, prompting a recommendation for an ear examination. The onset of these symptoms was approximately a year ago, coinciding with a family gathering on Easter. She perceives a decline in her hearing ability in her left ear, particularly noticeable during activities such as watching television before bedtime. She denies any associated tinnitus or other ear-related issues. She also denies any history of otitis media or otalgia.  Exam Normal ears.  Testing Pure tone audiometry reveals fairly symmetric mild to moderate sensorineural loss in both ears.  Speech discrimination is 72% in the left ear and 80% in the right ear.   1. Bilateral sensorineural hearing loss. The patient presents with mild-to-moderate hearing loss in both ears.  This hearing loss is sensorineural or inner ear hearing loss, a common type. This hearing loss may be age-related or noise exposure. A comprehensive discussion regarding hearing aids was conducted.  She is medically cleared and will arrange a hearing aid consultation.

## 2023-05-14 DIAGNOSIS — M170 Bilateral primary osteoarthritis of knee: Secondary | ICD-10-CM | POA: Diagnosis not present

## 2023-05-21 DIAGNOSIS — M170 Bilateral primary osteoarthritis of knee: Secondary | ICD-10-CM | POA: Diagnosis not present

## 2023-05-28 DIAGNOSIS — M170 Bilateral primary osteoarthritis of knee: Secondary | ICD-10-CM | POA: Diagnosis not present

## 2023-07-08 DIAGNOSIS — Z23 Encounter for immunization: Secondary | ICD-10-CM | POA: Diagnosis not present

## 2023-09-15 DIAGNOSIS — Z1339 Encounter for screening examination for other mental health and behavioral disorders: Secondary | ICD-10-CM | POA: Diagnosis not present

## 2023-09-15 DIAGNOSIS — Z0000 Encounter for general adult medical examination without abnormal findings: Secondary | ICD-10-CM | POA: Diagnosis not present

## 2023-09-15 DIAGNOSIS — Z1331 Encounter for screening for depression: Secondary | ICD-10-CM | POA: Diagnosis not present

## 2023-11-05 DIAGNOSIS — H35373 Puckering of macula, bilateral: Secondary | ICD-10-CM | POA: Diagnosis not present

## 2023-11-05 DIAGNOSIS — H5310 Unspecified subjective visual disturbances: Secondary | ICD-10-CM | POA: Diagnosis not present

## 2023-11-05 DIAGNOSIS — H524 Presbyopia: Secondary | ICD-10-CM | POA: Diagnosis not present

## 2023-12-14 DIAGNOSIS — E782 Mixed hyperlipidemia: Secondary | ICD-10-CM | POA: Diagnosis not present

## 2023-12-14 DIAGNOSIS — K5900 Constipation, unspecified: Secondary | ICD-10-CM | POA: Diagnosis not present

## 2023-12-14 DIAGNOSIS — E559 Vitamin D deficiency, unspecified: Secondary | ICD-10-CM | POA: Diagnosis not present

## 2023-12-14 DIAGNOSIS — E039 Hypothyroidism, unspecified: Secondary | ICD-10-CM | POA: Diagnosis not present

## 2023-12-20 DIAGNOSIS — E039 Hypothyroidism, unspecified: Secondary | ICD-10-CM | POA: Diagnosis not present

## 2023-12-20 DIAGNOSIS — Z789 Other specified health status: Secondary | ICD-10-CM | POA: Diagnosis not present

## 2023-12-20 DIAGNOSIS — E782 Mixed hyperlipidemia: Secondary | ICD-10-CM | POA: Diagnosis not present

## 2023-12-20 DIAGNOSIS — E559 Vitamin D deficiency, unspecified: Secondary | ICD-10-CM | POA: Diagnosis not present

## 2023-12-27 DIAGNOSIS — M170 Bilateral primary osteoarthritis of knee: Secondary | ICD-10-CM | POA: Diagnosis not present

## 2024-01-03 DIAGNOSIS — M170 Bilateral primary osteoarthritis of knee: Secondary | ICD-10-CM | POA: Diagnosis not present

## 2024-01-10 DIAGNOSIS — M170 Bilateral primary osteoarthritis of knee: Secondary | ICD-10-CM | POA: Diagnosis not present

## 2024-03-31 DIAGNOSIS — U071 COVID-19: Secondary | ICD-10-CM | POA: Diagnosis not present

## 2024-03-31 DIAGNOSIS — Z1159 Encounter for screening for other viral diseases: Secondary | ICD-10-CM | POA: Diagnosis not present

## 2024-03-31 DIAGNOSIS — R030 Elevated blood-pressure reading, without diagnosis of hypertension: Secondary | ICD-10-CM | POA: Diagnosis not present

## 2024-04-16 DIAGNOSIS — M542 Cervicalgia: Secondary | ICD-10-CM | POA: Diagnosis not present

## 2024-05-03 DIAGNOSIS — M47812 Spondylosis without myelopathy or radiculopathy, cervical region: Secondary | ICD-10-CM | POA: Diagnosis not present

## 2024-05-15 DIAGNOSIS — M47812 Spondylosis without myelopathy or radiculopathy, cervical region: Secondary | ICD-10-CM | POA: Diagnosis not present

## 2024-05-17 DIAGNOSIS — M47812 Spondylosis without myelopathy or radiculopathy, cervical region: Secondary | ICD-10-CM | POA: Diagnosis not present

## 2024-06-13 DIAGNOSIS — Z7185 Encounter for immunization safety counseling: Secondary | ICD-10-CM | POA: Diagnosis not present

## 2024-06-13 DIAGNOSIS — E782 Mixed hyperlipidemia: Secondary | ICD-10-CM | POA: Diagnosis not present

## 2024-06-13 DIAGNOSIS — Z23 Encounter for immunization: Secondary | ICD-10-CM | POA: Diagnosis not present

## 2024-06-13 DIAGNOSIS — I10 Essential (primary) hypertension: Secondary | ICD-10-CM | POA: Diagnosis not present

## 2024-06-13 DIAGNOSIS — E559 Vitamin D deficiency, unspecified: Secondary | ICD-10-CM | POA: Diagnosis not present

## 2024-07-11 DIAGNOSIS — E559 Vitamin D deficiency, unspecified: Secondary | ICD-10-CM | POA: Diagnosis not present

## 2024-07-11 DIAGNOSIS — Z79899 Other long term (current) drug therapy: Secondary | ICD-10-CM | POA: Diagnosis not present

## 2024-07-11 DIAGNOSIS — E785 Hyperlipidemia, unspecified: Secondary | ICD-10-CM | POA: Diagnosis not present

## 2024-07-11 DIAGNOSIS — I10 Essential (primary) hypertension: Secondary | ICD-10-CM | POA: Diagnosis not present

## 2024-07-17 DIAGNOSIS — I10 Essential (primary) hypertension: Secondary | ICD-10-CM | POA: Diagnosis not present

## 2024-07-17 DIAGNOSIS — Z0000 Encounter for general adult medical examination without abnormal findings: Secondary | ICD-10-CM | POA: Diagnosis not present

## 2024-08-01 ENCOUNTER — Encounter (HOSPITAL_COMMUNITY): Payer: Self-pay | Admitting: Internal Medicine

## 2024-08-01 ENCOUNTER — Emergency Department (HOSPITAL_BASED_OUTPATIENT_CLINIC_OR_DEPARTMENT_OTHER)

## 2024-08-01 ENCOUNTER — Other Ambulatory Visit: Payer: Self-pay

## 2024-08-01 ENCOUNTER — Observation Stay (HOSPITAL_BASED_OUTPATIENT_CLINIC_OR_DEPARTMENT_OTHER)
Admission: EM | Admit: 2024-08-01 | Discharge: 2024-08-02 | Disposition: A | Discharge status: Home / Self Care | Source: Ambulatory Visit | Attending: Internal Medicine | Admitting: Internal Medicine

## 2024-08-01 DIAGNOSIS — G459 Transient cerebral ischemic attack, unspecified: Secondary | ICD-10-CM | POA: Diagnosis not present

## 2024-08-01 DIAGNOSIS — Z7982 Long term (current) use of aspirin: Secondary | ICD-10-CM | POA: Insufficient documentation

## 2024-08-01 DIAGNOSIS — H5462 Unqualified visual loss, left eye, normal vision right eye: Secondary | ICD-10-CM | POA: Diagnosis not present

## 2024-08-01 DIAGNOSIS — E039 Hypothyroidism, unspecified: Secondary | ICD-10-CM | POA: Diagnosis not present

## 2024-08-01 DIAGNOSIS — R29818 Other symptoms and signs involving the nervous system: Secondary | ICD-10-CM | POA: Diagnosis not present

## 2024-08-01 DIAGNOSIS — G453 Amaurosis fugax: Principal | ICD-10-CM | POA: Insufficient documentation

## 2024-08-01 DIAGNOSIS — I10 Essential (primary) hypertension: Secondary | ICD-10-CM | POA: Insufficient documentation

## 2024-08-01 DIAGNOSIS — Z79899 Other long term (current) drug therapy: Secondary | ICD-10-CM | POA: Insufficient documentation

## 2024-08-01 DIAGNOSIS — H539 Unspecified visual disturbance: Secondary | ICD-10-CM | POA: Diagnosis not present

## 2024-08-01 DIAGNOSIS — Z7401 Bed confinement status: Secondary | ICD-10-CM | POA: Diagnosis not present

## 2024-08-01 DIAGNOSIS — H53122 Transient visual loss, left eye: Secondary | ICD-10-CM | POA: Diagnosis present

## 2024-08-01 DIAGNOSIS — H9192 Unspecified hearing loss, left ear: Secondary | ICD-10-CM | POA: Insufficient documentation

## 2024-08-01 DIAGNOSIS — Z7901 Long term (current) use of anticoagulants: Secondary | ICD-10-CM | POA: Insufficient documentation

## 2024-08-01 DIAGNOSIS — E785 Hyperlipidemia, unspecified: Secondary | ICD-10-CM | POA: Insufficient documentation

## 2024-08-01 LAB — URINALYSIS, ROUTINE W REFLEX MICROSCOPIC
Bacteria, UA: NONE SEEN
Bilirubin Urine: NEGATIVE
Glucose, UA: NEGATIVE mg/dL
Hgb urine dipstick: NEGATIVE
Ketones, ur: NEGATIVE mg/dL
Nitrite: NEGATIVE
Protein, ur: NEGATIVE mg/dL
Specific Gravity, Urine: <1.005 — ABNORMAL LOW (ref 1.005–1.030)
pH: 6.5 (ref 5.0–8.0)

## 2024-08-01 LAB — URINE DRUG SCREEN
Amphetamines: NEGATIVE
Barbiturates: NEGATIVE
Benzodiazepines: NEGATIVE
Cocaine: NEGATIVE
Fentanyl: NEGATIVE
Methadone Scn, Ur: NEGATIVE
Opiates: NEGATIVE
Tetrahydrocannabinol: NEGATIVE

## 2024-08-01 LAB — COMPREHENSIVE METABOLIC PANEL WITH GFR
ALT: 18 U/L (ref 0–44)
AST: 22 U/L (ref 15–41)
Albumin: 4.3 g/dL (ref 3.5–5.0)
Alkaline Phosphatase: 77 U/L (ref 38–126)
Anion gap: 11 (ref 5–15)
BUN: 12 mg/dL (ref 8–23)
CO2: 24 mmol/L (ref 22–32)
Calcium: 9.7 mg/dL (ref 8.9–10.3)
Chloride: 102 mmol/L (ref 98–111)
Creatinine, Ser: 0.82 mg/dL (ref 0.44–1.00)
GFR, Estimated: >60 mL/min (ref >60)
Glucose, Bld: 101 mg/dL — ABNORMAL HIGH (ref 70–99)
Potassium: 3.8 mmol/L (ref 3.5–5.1)
Sodium: 138 mmol/L (ref 135–145)
Total Bilirubin: 0.5 mg/dL (ref 0.0–1.2)
Total Protein: 6.7 g/dL (ref 6.5–8.1)

## 2024-08-01 LAB — DIFFERENTIAL
Abs Immature Granulocytes: 0.01 K/uL (ref 0.00–0.07)
Basophils Absolute: 0 K/uL (ref 0.0–0.1)
Basophils Relative: 1 %
Eosinophils Absolute: 0.1 K/uL (ref 0.0–0.5)
Eosinophils Relative: 1 %
Immature Granulocytes: 0 %
Lymphocytes Relative: 22 %
Lymphs Abs: 1.5 K/uL (ref 0.7–4.0)
Monocytes Absolute: 0.7 K/uL (ref 0.1–1.0)
Monocytes Relative: 9 %
Neutro Abs: 4.8 K/uL (ref 1.7–7.7)
Neutrophils Relative %: 67 %

## 2024-08-01 LAB — CBC
HCT: 36.2 % (ref 36.0–46.0)
Hemoglobin: 12.3 g/dL (ref 12.0–15.0)
MCH: 32.9 pg (ref 26.0–34.0)
MCHC: 34 g/dL (ref 30.0–36.0)
MCV: 96.8 fL (ref 80.0–100.0)
Platelets: 220 K/uL (ref 150–400)
RBC: 3.74 MIL/uL — ABNORMAL LOW (ref 3.87–5.11)
RDW: 11.7 % (ref 11.5–15.5)
WBC: 7 K/uL (ref 4.0–10.5)
nRBC: 0 % (ref 0.0–0.2)

## 2024-08-01 LAB — ETHANOL: Alcohol, Ethyl (B): <15 mg/dL (ref <15)

## 2024-08-01 LAB — PROTIME-INR
INR: 0.9 (ref 0.8–1.2)
Prothrombin Time: 13.2 s (ref 11.4–15.2)

## 2024-08-01 LAB — APTT: aPTT: 30 s (ref 24–36)

## 2024-08-01 MED ORDER — SENNOSIDES-DOCUSATE SODIUM 8.6-50 MG PO TABS: 1.0000 | ORAL_TABLET | Freq: Every evening | ORAL | Status: DC | PRN

## 2024-08-01 MED ORDER — LEVOTHYROXINE SODIUM 88 MCG PO TABS
88.0000 ug | ORAL_TABLET | Freq: Every day | ORAL | Status: DC
  Administered 2024-08-02: 88 ug via ORAL
  Filled 2024-08-01: qty 1

## 2024-08-01 MED ORDER — ONDANSETRON HCL 4 MG/2ML IJ SOLN: 4.0000 mg | Freq: Four times a day (QID) | INTRAMUSCULAR | Status: DC | PRN

## 2024-08-01 MED ORDER — LORAZEPAM 2 MG/ML IJ SOLN
1.0000 mg | Freq: Once | INTRAMUSCULAR | Status: AC | PRN
  Administered 2024-08-02: 1 mg via INTRAVENOUS
  Filled 2024-08-01: qty 1

## 2024-08-01 MED ORDER — ASPIRIN 81 MG PO TBEC
81.0000 mg | DELAYED_RELEASE_TABLET | Freq: Every day | ORAL | Status: DC
  Administered 2024-08-02: 81 mg via ORAL
  Filled 2024-08-01: qty 1

## 2024-08-01 MED ORDER — STROKE: EARLY STAGES OF RECOVERY BOOK
Freq: Once | Status: AC
  Filled 2024-08-01: qty 1

## 2024-08-01 MED ORDER — ROSUVASTATIN CALCIUM 20 MG PO TABS: 20.0000 mg | ORAL_TABLET | Freq: Every evening | ORAL | Status: DC

## 2024-08-01 MED ORDER — ACETAMINOPHEN 160 MG/5ML PO SOLN: 650.0000 mg | ORAL | Status: DC | PRN

## 2024-08-01 MED ORDER — ACETAMINOPHEN 650 MG RE SUPP: 650.0000 mg | RECTAL | Status: DC | PRN

## 2024-08-01 MED ORDER — ENOXAPARIN SODIUM 40 MG/0.4ML IJ SOSY
40.0000 mg | PREFILLED_SYRINGE | INTRAMUSCULAR | Status: DC
  Administered 2024-08-02: 40 mg via SUBCUTANEOUS
  Filled 2024-08-01: qty 0.4

## 2024-08-01 MED ORDER — ACETAMINOPHEN 325 MG PO TABS: 650.0000 mg | ORAL_TABLET | ORAL | Status: DC | PRN

## 2024-08-01 MED ORDER — IOHEXOL 350 MG/ML SOLN
75.0000 mL | Freq: Once | INTRAVENOUS | Status: AC | PRN
  Administered 2024-08-01: 75 mL via INTRAVENOUS

## 2024-08-01 NOTE — Plan of Care (Signed)
 On-call neurology note  Patient, 83 year old with history of hypertension hyperlipidemia presenting to the freestanding ER by the way of an ophthalmologist office for evaluation of few minutes of vision loss in her left eye that was only limited to the lower quadrant of the left eye. Given her age and risk factors, stroke/TIA workup recommended. Neurology would be happy to see her in consultation once she arrives at [facility] let the on-call neurologist know of her arrival. Patient can be admitted to the hospitalist for stroke risk factor workup including CTA head and neck, 2D echo, A1c, lipid panel, frequent neurochecks, telemetry and therapy evaluations.  Eligio Lav, MD Neurology

## 2024-08-01 NOTE — ED Provider Notes (Signed)
 [place] [facility] AT [facility] Provider Note   CSN: [id number] Arrival date & time: 08/01/24  1327     Patient presents with: Visual Field Change   Kristi Bender is a 83 y.o. female with past medical history of hypothyroidism, HLD presents Emergency Department for evaluation of left eye visual disturbance that occurred at 2200 last evening.  Reports that her lower half of her vision went suddenly black for approximately 5 minutes then resolved spontaneously. Also endorses decreased hearing from left ear and feels like ear is underwater.  No complaints of slurred speech, aphasia, difficulty ambulating at time of visual disturbance.  Was evaluated at [facility] who recommended ED evaluation for stroke. No head injury, thinners, HA    HPI     Prior to Admission medications   Medication Sig Start Date End Date Taking? Authorizing Provider  gabapentin (NEURONTIN) 100 MG capsule Take 100 mg by mouth 2 (two) times daily as needed. 11/14/19   [provider]  levothyroxine (SYNTHROID) 88 MCG tablet  06/05/19   [provider]  rosuvastatin (CRESTOR) 5 MG tablet  06/05/19   [provider]    Allergies: Patient has no known allergies.    Review of Systems  Neurological:  Negative for weakness.    Updated Vital Signs BP 139/74   Pulse 87   Temp 99.7 F (37.6 C) (Oral)   Resp 16   SpO2 98%   Physical Exam Vitals and nursing note reviewed.  Constitutional:      General: She is not in acute distress.    Appearance: Normal appearance. She is not ill-appearing.  HENT:     Head: Normocephalic and atraumatic.     Right Ear: Hearing and tympanic membrane normal.     Left Ear: Tympanic membrane normal. Decreased hearing noted.     Ears:     Comments: No tragal tenderness bilaterally Eyes:     General: Lids are normal. Vision grossly intact. No visual field deficit.    Extraocular Movements:     Right eye: Normal extraocular  motion and no nystagmus.     Left eye: Normal extraocular motion and no nystagmus.     Conjunctiva/sclera: Conjunctivae normal.     Right eye: Right conjunctiva is not injected.     Left eye: Left conjunctiva is not injected.     Comments: Right eye <63mm. Left eye 8mm from dilated examination at ophthalmology office this morning. No horizontal nor vertical nystagmus. Denies current visual disturbances  Cardiovascular:     Rate and Rhythm: Normal rate.     Pulses: Normal pulses.  Pulmonary:     Effort: Pulmonary effort is normal. No respiratory distress.     Breath sounds: Normal breath sounds.  Musculoskeletal:     Cervical back: Normal range of motion and neck supple. No rigidity.  Skin:    Coloration: Skin is not jaundiced or pale.  Neurological:     General: No focal deficit present.     Mental Status: She is alert and oriented to person, place, and time. Mental status is at baseline.     GCS: GCS eye subscore is 4. GCS verbal subscore is 5. GCS motor subscore is 6.     Cranial Nerves: No cranial nerve deficit, dysarthria or facial asymmetry.     Sensory: Sensation is intact. No sensory deficit.     Motor: No weakness, tremor, atrophy, abnormal muscle tone, seizure activity or pronator drift.     Coordination: Coordination  normal. Finger-Nose-Finger Test and Heel to [facility] Test normal.     Gait: Gait is intact.     Comments: Following commands appropriately.  Motor 5/5 and sensation 2/2 BUE and BLE.  No aphasia nor slurred speech.       (all labs ordered are listed, but only abnormal results are displayed) Labs Reviewed  CBC - Abnormal; Notable for the following components:      Result Value   RBC 3.74 (*)    All other components within normal limits  COMPREHENSIVE METABOLIC PANEL WITH GFR - Abnormal; Notable for the following components:   Glucose, Bld 101 (*)    All other components within normal limits  URINALYSIS, ROUTINE W REFLEX MICROSCOPIC - Abnormal; Notable for the  following components:   Color, Urine COLORLESS (*)    Specific Gravity, Urine <1.005 (*)    Leukocytes,Ua TRACE (*)    All other components within normal limits  ETHANOL  PROTIME-INR  APTT  DIFFERENTIAL  URINE DRUG SCREEN    EKG: None  Radiology: CT ANGIO HEAD NECK W WO CM Result Date: 08/01/2024 EXAM: CTA HEAD AND NECK WITH AND WITHOUT 08/01/2024 03:15:11 PM TECHNIQUE: CTA of the head and neck was performed with and without the administration of 75 mL of iohexol (OMNIPAQUE) 350 MG/ML injection. Multiplanar 2D and/or 3D reformatted images are provided for review. Automated exposure control, iterative reconstruction, and/or weight based adjustment of the mA/kV was utilized to reduce the radiation dose to as low as reasonably achievable. Stenosis of the internal carotid arteries measured using NASCET criteria. COMPARISON: Same day CT head. CLINICAL HISTORY: Neuro deficit, acute, stroke suspected. Patient states last night she had partial vision loss to left eye. States vision returned and she went to the eye doctor who recommended she come to the ER for stroke work up. No current symptoms. Non con head completed earlier. FINDINGS: CTA NECK: AORTIC ARCH AND ARCH VESSELS: Atherosclerosis of the visualized aortic arch. Common origin of the brachiocephalic and left common carotid arteries. Atherosclerosis of the proximal left subclavian artery without stenosis. No dissection or arterial injury. CERVICAL CAROTID ARTERIES: No dissection, arterial injury, or hemodynamically significant stenosis by NASCET criteria. CERVICAL VERTEBRAL ARTERIES: There is mild tortuosity of the V1 segments of the bilateral vertebral arteries. The vertebral arteries are patent from the origins to the vertebrobasilar confluence. No dissection, arterial injury, or significant stenosis. LUNGS AND MEDIASTINUM: Unremarkable. SOFT TISSUES: No acute abnormality. BONES: Degenerative changes throughout the visualized spine. No acute  abnormality. CTA HEAD: ANTERIOR CIRCULATION: The intracranial internal carotid arteries are patent bilaterally. There is mild atherosclerosis of the carotid siphons without stenosis. The middle cerebral arteries are patent bilaterally. The anterior cerebral arteries are patent bilaterally. No aneurysm. POSTERIOR CIRCULATION: Fetal origin of the left PCA. No significant stenosis of the posterior cerebral arteries. No significant stenosis of the basilar artery. No significant stenosis of the vertebral arteries. No aneurysm. OTHER: No dural venous sinus thrombosis on this non-dedicated study. IMPRESSION: 1. No large vessel occlusion, hemodynamically significant stenosis, or aneurysm in the head or neck. Electronically signed by: Donnice Mania MD 08/01/2024 03:58 PM EDT RP Workstation: [id number]   CT Head Wo Contrast Result Date: 08/01/2024 EXAM: CT HEAD WITHOUT CONTRAST 08/01/2024 02:26:21 PM TECHNIQUE: CT of the head was performed without the administration of intravenous contrast. Automated exposure control, iterative reconstruction, and/or weight based adjustment of the mA/kV was utilized to reduce the radiation dose to as low as reasonably achievable. COMPARISON: None available. CLINICAL HISTORY: Neuro deficit, acute,  stroke suspected. Patient states last night she had partial vision loss to left eye. States vision returned and she went to the eye doctor who recommended she come to the ER for stroke work up. No current symptoms. FINDINGS: BRAIN AND VENTRICLES: No acute hemorrhage. No evidence of acute infarct. No hydrocephalus. No extra-axial collection. No mass effect or midline shift. ORBITS: Bilateral lens replacement. SINUSES: No acute abnormality. SOFT TISSUES AND SKULL: No acute soft tissue abnormality. No skull fracture. IMPRESSION: 1. No acute intracranial abnormality. Electronically signed by: Donnice Mania MD 08/01/2024 02:33 PM EDT RP Workstation: [id number]      Medications Ordered in the ED   iohexol (OMNIPAQUE) 350 MG/ML injection 75 mL (75 mLs Intravenous Contrast Given 08/01/24 1510)                                    Medical Decision Making Amount and/or Complexity of Data Reviewed Labs: ordered. Radiology: ordered.  Risk Prescription drug management. Decision regarding hospitalization.   Patient presents to the ED for concern of visual deficit, this involves an extensive number of treatment options, and is a complaint that carries with it a high risk of complications and morbidity.  The differential diagnosis includes retinal detachment, CRAO, CRVO, CVA/TIA, FB, abrasion, ulcer, cataract, migraine   Co morbidities that complicate the patient evaluation  HTN, HLD, hypothyroidism   Additional history obtained:  Additional history obtained from Nursing   External records from outside source obtained and reviewed including triage note   Lab Tests:  I Ordered, and personally interpreted labs.  The pertinent results include:   CBG 101 UA with trace leukocytes and 6-10 WBC   Imaging Studies ordered:  I ordered imaging studies including CT head, CTA, MRI brain I independently visualized and interpreted imaging which showed  No acute intracranial abnormalities on CT head CTA: No large vessel occlusion, hemodynamically significant stenosis, or aneurysm in the head or neck. MRI to be obtained during admission at [facility] I agree with the radiologist interpretation   Cardiac Monitoring:  The patient was maintained on a cardiac monitor.  I personally viewed and interpreted the cardiac monitored which showed an underlying rhythm of: NSR at 76 bpm with no ST no T wave abnormalities     Consultations Obtained:  I requested consultation with neurology Dr. Voncile,  and discussed lab and imaging findings as well as pertinent plan - they recommend:  Admission for stroke/TIA workup MRI brain without contrast Hospitalist admission Neurology to consult on patient See  their note  I requested consultation with hospitalist Dr. Arlice,  and discussed lab and imaging findings as well as pertinent plan - accepts patient for admission at [facility]   Problem List / ED Course:  Visual disturbance No current visual disturbances nor neurological deficits currently. Denies head injury. No thinners RF for CVA/TIA include HTN (on losartan), HLD (on statin) Labs noncontributory. UA wo infection CT head without ICH.  CTA wo abnormalities Consulted neurology who recommends admission for stroke/TIA workup.   Will obtain MRI at [facility] as we do not have MRI here at [facility].  Patient will need significant anxiety medication as she reports severe claustrophobia and concern regarding obtaining MRI  Decreased hearing of left ear No impacted cerumen No AOM bilaterally No tragal tenderness to suggest otitis externa No signs of perforated TM Does have a history of decreased hearing and is followed up by otolaryngology and last all in  2024 but reports that left ear hearing is acute   Reevaluation:  After the interventions noted above, I reevaluated the patient and found that they have :stayed the same    Dispostion:  After consideration of the diagnostic results and the patients response to treatment, I feel that the patent would benefit from admission to [facility] for CVA/TIA workup, MRI brain.   Discussed ED workup, disposition, return to ED precautions with patient who expresses understanding agrees with plan.  All questions answered to their satisfaction.  They are agreeable to plan.   Final diagnoses:  Visual disturbance  Decreased hearing of left ear    ED Discharge Orders     None        Minnie Tinnie BRAVO, PA 08/01/24 1649    Freddi Hamilton, MD 08/02/24 [phone]

## 2024-08-01 NOTE — ED Notes (Signed)
 Passed swallow screen

## 2024-08-01 NOTE — ED Triage Notes (Signed)
 Patient states last night she had partial vision loss to left eye. States vision returned and she went to the eye doctor who recommended she come to the ER for stroke work up. No current symptoms.

## 2024-08-01 NOTE — ED Notes (Signed)
 Called [facility]at[facility] at [facility] for transport

## 2024-08-01 NOTE — H&P (Signed)
 History and Physical    Kristi Bender FMW:[id number] DOB: 02-02-1941 DOA: 08/01/2024  PCP: Waylan Almarie SAUNDERS, MD  Patient coming from: Home  I have personally briefly reviewed patient's old medical records in [facility]  Chief Complaint: Transient partial left vision loss  HPI: Kristi Bender is a 83 y.o. female with medical history significant for HTN, HLD, hypothyroidism who presented to the ED for evaluation of transient partial left vision loss at the advice of her ophthalmologist.  Patient states that she was sitting down watching TV and working on her laptop last night when she suddenly developed loss of vision in the left lower quadrant of her left eye.  She says it was like a black curtain covering that part of her visual field.  She says this resolved within 5 minutes.  She saw her ophthalmologist Dr. Waylan this morning.  Funduscopic exam was normal per patient.  He was concerned about TIA and recommended that she come to the hospital for further evaluation.  Patient denies any associated headache, chest pain, palpitations.  She denies any similar episode of vision loss in the past.  She has not had any weakness in her extremities or new numbness/tingling.  She says she was recently started on valsartan in August for blood pressure.  Her other medications are rosuvastatin and Synthroid.    [facility] ED Course  Labs/Imaging on admission: I have personally reviewed following labs and imaging studies.  Initial vitals showed BP 139/74, pulse 87, RR 16, temp 99.7 F, SpO2 98% on room air.  Labs showed WBC 7.0, hemoglobin 12.3, platelets 220, sodium 138, potassium 3.8, bicarb 24, BUN 12, creatinine 0.82, serum glucose 101, LFTs within normal limits, UA negative for UTI.  UDS negative.  Serum ethanol <15.  CT head without contrast negative for acute or cranial abnormality.  CTA head/neck negative for LVO, hemodynamically significant stenosis, or aneurysm.  EDP  discussed with neurology, Dr. Voncile, who recommended medical admission to [facility] for stroke/TIA workup.  They will see in consultation.  The hospitalist service was consulted for admission.  Review of Systems: All systems reviewed and are negative except as documented in history of present illness above.   Past Medical History:  Diagnosis Date   Essential hypertension 08/01/2024   Hyperlipidemia 08/01/2024   Hypothyroidism 08/01/2024    No past surgical history on file.  Social History: Social History   Tobacco Use   Smoking status: Never   Smokeless tobacco: Never   No Known Allergies  No family history on file.   Prior to Admission medications   Medication Sig Start Date End Date Taking? Authorizing Provider  gabapentin (NEURONTIN) 100 MG capsule Take 100 mg by mouth 2 (two) times daily as needed. 11/14/19   [provider]  levothyroxine (SYNTHROID) 88 MCG tablet  06/05/19   [provider]  rosuvastatin (CRESTOR) 5 MG tablet  06/05/19   [provider]    Physical Exam: Vitals:   08/01/24 1335 08/01/24 1820 08/01/24 1938  BP: 139/74  (!) 124/100  Pulse: 87  78  Resp: 16  17  Temp: 99.7 F (37.6 C) 98.3 F (36.8 C) 97.6 F (36.4 C)  TempSrc: Oral Oral Oral  SpO2: 98%  100%  Weight:   58 kg  Height:   5' 2 (1.575 m)   Constitutional: NAD, calm, comfortable Eyes: PERRL, EOMI, lids and conjunctivae normal ENMT: Mucous membranes are moist. Posterior pharynx clear of any exudate or lesions.Normal  dentition.  Neck: normal, supple, no masses. Respiratory: clear to auscultation bilaterally, no wheezing, no crackles. Normal respiratory effort. No accessory muscle use.  Cardiovascular: Regular rate and rhythm, no murmurs / rubs / gallops. No extremity edema. 2+ pedal pulses. Abdomen: no tenderness, no masses palpated. Musculoskeletal: no clubbing / cyanosis. No joint deformity upper and lower extremities. Good ROM, no contractures.  Normal muscle tone.  Skin: no rashes, lesions, ulcers. No induration Neurologic: Sensation intact. Strength 5/5 in all 4.  Psychiatric: Normal judgment and insight. Alert and oriented x 3. Normal mood.   EKG: Personally reviewed. Sinus rhythm, rate 76, no acute ischemic changes.  No prior for comparison.  Assessment/Plan Principal Problem:   TIA (transient ischemic attack) Active Problems:   Essential hypertension   Hyperlipidemia   Hypothyroidism   Kristi Bender is a 83 y.o. female with medical history significant for HTN, HLD, hypothyroidism who is admitted for evaluation of transient vision loss in her left eye.  Assessment and Plan: Transient partial vision loss of the left eye: Patient presenting with transient left inferior quadrantanopia at night of 9/29.  Symptoms resolved within 5 minutes.  She saw her ophthalmologist who sent her to the hospital for TIA evaluation.  CT head and CTA head/neck are negative. - Neurology consult pending - Obtain MRI brain - Obtain echocardiogram - Keep on telemetry, continue neurochecks - Start aspirin 81 mg daily - Increase home rosuvastatin to 20 mg daily - Check lipid panel and A1c - PT/OT/SLP eval - Allowing permissive hypertension for now  Hypertension: Holding home valsartan for now.  Hyperlipidemia: Increase home rosuvastatin to 20 mg daily.  Hypothyroidism: Continue Synthroid.   DVT prophylaxis: enoxaparin (LOVENOX) injection 40 mg Start: 08/02/24 1000 Code Status:   Code Status: Limited: Do not attempt resuscitation (DNR) -DNR-LIMITED -Do Not Intubate/DNI confirmed with patient on admission. Family Communication: Discussed with patient, she has discussed with family Disposition Plan: From home, likely discharge to home pending clinical progress Consults called: Neurology Severity of Illness: The appropriate patient status for this patient is OBSERVATION. Observation status is judged to be reasonable and necessary in order  to provide the required intensity of service to ensure the patient's safety. The patient's presenting symptoms, physical exam findings, and initial radiographic and laboratory data in the context of their medical condition is felt to place them at decreased risk for further clinical deterioration. Furthermore, it is anticipated that the patient will be medically stable for discharge from the hospital within 2 midnights of admission. bservation/Inpatient:[id number]}  Jorie Blanch MD [facility]  If 7PM-7AM, please contact night-coverage [email or web address]  08/01/2024, 10:00 PM

## 2024-08-01 NOTE — Progress Notes (Signed)
 Facility requesting transfer: [facility] ED Requesting Provider: ED PA Loraine Reason for transfer: TIA workup  Facility course:  Kristi Bender is a 83 y.o. female with PMH significant for HTN, HLD. Last night, patient had few minutes of vision loss in her left eye.  Symptoms resolved in about 5 minutes.  She went to see her eye doctor today who sent her to the ED for stroke evaluation. Hemodynamically stable Labs unremarkable Initial CT head and CTA head and neck unremarkable EDP discussed with neurology on-call Dr. Voncile. Recommended TIA workup. See neurology note.  Will accept as observation with medical telemetry bed at stroke unit    Please note that [facility] will assume the care once patient arrives to the hospital. Prior to that, please reach out to the ED provider on site for any medical need.   Signed, Chapman Rota, MD [facility] 08/01/2024

## 2024-08-01 NOTE — Hospital Course (Signed)
 Kristi Bender is a 83 y.o. female with medical history significant for HTN, HLD, hypothyroidism who is admitted for evaluation of transient vision loss in her left eye.

## 2024-08-02 ENCOUNTER — Encounter (HOSPITAL_COMMUNITY): Payer: Self-pay | Admitting: Internal Medicine

## 2024-08-02 ENCOUNTER — Observation Stay (HOSPITAL_COMMUNITY)

## 2024-08-02 DIAGNOSIS — R29700 NIHSS score 0: Secondary | ICD-10-CM

## 2024-08-02 DIAGNOSIS — E785 Hyperlipidemia, unspecified: Secondary | ICD-10-CM | POA: Diagnosis not present

## 2024-08-02 DIAGNOSIS — I10 Essential (primary) hypertension: Secondary | ICD-10-CM

## 2024-08-02 DIAGNOSIS — R29818 Other symptoms and signs involving the nervous system: Secondary | ICD-10-CM | POA: Diagnosis not present

## 2024-08-02 DIAGNOSIS — G453 Amaurosis fugax: Secondary | ICD-10-CM | POA: Diagnosis not present

## 2024-08-02 DIAGNOSIS — I639 Cerebral infarction, unspecified: Secondary | ICD-10-CM

## 2024-08-02 DIAGNOSIS — G459 Transient cerebral ischemic attack, unspecified: Secondary | ICD-10-CM | POA: Diagnosis not present

## 2024-08-02 LAB — LIPID PANEL
Cholesterol: 150 mg/dL (ref 0–200)
HDL: 63 mg/dL (ref >40)
LDL Cholesterol: 80 mg/dL (ref 0–99)
Total CHOL/HDL Ratio: 2.4 ratio
Triglycerides: 34 mg/dL (ref <150)
VLDL: 7 mg/dL (ref 0–40)

## 2024-08-02 LAB — ECHOCARDIOGRAM COMPLETE
AR max vel: 2.47 cm2
AV Area VTI: 2.31 cm2
AV Area mean vel: 2.01 cm2
AV Mean grad: 4 mmHg
AV Peak grad: 8.1 mmHg
Ao pk vel: 1.42 m/s
Area-P 1/2: 2.63 cm2
Calc EF: 67 %
Height: 62 in
MV M vel: 3.68 m/s
MV Peak grad: 54.2 mmHg
MV VTI: 2.42 cm2
P 1/2 time: 495 ms
S' Lateral: 2.9 cm
Single Plane A2C EF: 63.4 %
Single Plane A4C EF: 71 %
Weight: 2045.87 [oz_av]

## 2024-08-02 LAB — CBC
HCT: 35.5 % — ABNORMAL LOW (ref 36.0–46.0)
Hemoglobin: 12.3 g/dL (ref 12.0–15.0)
MCH: 33.2 pg (ref 26.0–34.0)
MCHC: 34.6 g/dL (ref 30.0–36.0)
MCV: 95.7 fL (ref 80.0–100.0)
Platelets: 222 K/uL (ref 150–400)
RBC: 3.71 MIL/uL — ABNORMAL LOW (ref 3.87–5.11)
RDW: 11.8 % (ref 11.5–15.5)
WBC: 5.9 K/uL (ref 4.0–10.5)
nRBC: 0 % (ref 0.0–0.2)

## 2024-08-02 LAB — BASIC METABOLIC PANEL WITH GFR
Anion gap: 10 (ref 5–15)
BUN: 9 mg/dL (ref 8–23)
CO2: 26 mmol/L (ref 22–32)
Calcium: 9.3 mg/dL (ref 8.9–10.3)
Chloride: 104 mmol/L (ref 98–111)
Creatinine, Ser: 0.8 mg/dL (ref 0.44–1.00)
GFR, Estimated: >60 mL/min (ref >60)
Glucose, Bld: 95 mg/dL (ref 70–99)
Potassium: 4.1 mmol/L (ref 3.5–5.1)
Sodium: 140 mmol/L (ref 135–145)

## 2024-08-02 LAB — HEMOGLOBIN A1C
Hgb A1c MFr Bld: 5.4 % (ref 4.8–5.6)
Mean Plasma Glucose: 108.28 mg/dL

## 2024-08-02 MED ORDER — CLOPIDOGREL BISULFATE 75 MG PO TABS: 75.0000 mg | ORAL_TABLET | Freq: Every day | ORAL | Status: DC

## 2024-08-02 MED ORDER — ROSUVASTATIN CALCIUM 5 MG PO TABS: 10.0000 mg | ORAL_TABLET | Freq: Every evening | ORAL | Status: DC

## 2024-08-02 MED ORDER — ASPIRIN 81 MG PO TBEC: 81.0000 mg | DELAYED_RELEASE_TABLET | Freq: Every day | ORAL | 2 refills | Status: AC

## 2024-08-02 MED ORDER — CLOPIDOGREL BISULFATE 300 MG PO TABS
300.0000 mg | ORAL_TABLET | Freq: Once | ORAL | Status: AC
  Administered 2024-08-02: 300 mg via ORAL
  Filled 2024-08-02: qty 1

## 2024-08-02 MED ORDER — CLOPIDOGREL BISULFATE 75 MG PO TABS: 75.0000 mg | ORAL_TABLET | Freq: Every day | ORAL | 0 refills | Status: AC

## 2024-08-02 MED ORDER — ROSUVASTATIN CALCIUM 10 MG PO TABS: 10.0000 mg | ORAL_TABLET | Freq: Every evening | ORAL | 2 refills | Status: AC

## 2024-08-02 NOTE — Care Management Obs Status (Signed)
 MEDICARE OBSERVATION STATUS NOTIFICATION   Patient Details  Name: Kristi Bender MRN: [id number] Date of Birth: 11/26/1940   Medicare Observation Status Notification Given:  Yes  Verbally reviewed observation notice with Richardson Foster telephonically at [phone].  Patient ask for the obs notice to be sent to her home address    Claretta Deed 08/02/2024, 1:49 PM

## 2024-08-02 NOTE — TOC Transition Note (Signed)
 Transition of Care [facility]) - Discharge Note   Patient Details  Name: Kristi Bender MRN: [id number] Date of Birth: 1941-10-06  [facility] CM/SW Contact:  Andrez JULIANNA George, RN Phone Number: 08/02/2024, 12:46 PM   Clinical Narrative:    Pt is discharging home with self care. No needs per IP Care management.  No needs per therapy. Pt has transportation home.   Final next level of care: Home/Self Care Barriers to Discharge: No Barriers Identified   Patient Goals and CMS Choice            Discharge Placement                       Discharge Plan and Services Additional resources added to the After Visit Summary for                                       Social Drivers of Health (SDOH) Interventions SDOH Screenings   Food Insecurity: No Food Insecurity (08/01/2024)  Housing: Low Risk  (08/01/2024)  Transportation Needs: No Transportation Needs (08/01/2024)  Utilities: Not At Risk (08/01/2024)  Social Connections: Moderately Isolated (08/01/2024)  Tobacco Use: Low Risk  (08/02/2024)     Readmission Risk Interventions     No data to display

## 2024-08-02 NOTE — Progress Notes (Addendum)
 STROKE TEAM PROGRESS NOTE   SUBJECTIVE (INTERVAL HISTORY) No family is at the bedside.  Overall her condition is completely resolved. She stated that she had left eye lower visual field vision loss for several minutes, and resolved, no recurrence. No pain. Her eye doctor recommend ED evaluation. So far work up neg. 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    OBJECTIVE Temp:  [97.6 F (36.4 C)-98.5 F (36.9 C)] 97.9 F (36.6 C) (10/01 1130) Pulse Rate:  [59-78] 74 (10/01 1130) Cardiac Rhythm: Normal sinus rhythm (10/01 0700) Resp:  [16-18] 18 (10/01 1130) BP: (110-134)/(60-100) 110/66 (10/01 1130) SpO2:  [96 %-100 %] 100 % (10/01 1130) Weight:  [58 kg] 58 kg (09/30 1938)  No results for input(s): GLUCAP in the last 168 hours. Recent Labs  Lab 08/01/24 1416 08/02/24 0454  NA 138 140  K 3.8 4.1  CL 102 104  CO2 24 26  GLUCOSE 101* 95  BUN 12 9  CREATININE 0.82 0.80  CALCIUM 9.7 9.3   Recent Labs  Lab 08/01/24 1416  AST 22  ALT 18  ALKPHOS 77  BILITOT 0.5  PROT 6.7  ALBUMIN 4.3   Recent Labs  Lab 08/01/24 1416 08/02/24 0454  WBC 7.0 5.9  NEUTROABS 4.8  --   HGB 12.3 12.3  HCT 36.2 35.5*  MCV 96.8 95.7  PLT 220 222   No results for input(s): CKTOTAL, CKMB, CKMBINDEX, TROPONINI in the last 168 hours. Recent Labs    08/01/24 1416  LABPROT 13.2  INR 0.9   Recent Labs    08/01/24 1416  COLORURINE COLORLESS*  LABSPEC <1.005*  PHURINE 6.5  GLUCOSEU NEGATIVE  HGBUR NEGATIVE  BILIRUBINUR NEGATIVE  KETONESUR NEGATIVE  PROTEINUR NEGATIVE  NITRITE NEGATIVE  LEUKOCYTESUR TRACE*       Component Value Date/Time   CHOL 150 08/02/2024 0454   TRIG 34 08/02/2024 0454   HDL 63 08/02/2024 0454   CHOLHDL 2.4 08/02/2024 0454   VLDL 7 08/02/2024 0454   LDLCALC 80 08/02/2024 0454   Lab Results  Component Value Date    HGBA1C 5.4 08/02/2024      Component Value Date/Time   LABOPIA NEGATIVE 08/01/2024 1416   COCAINSCRNUR NEGATIVE 08/01/2024 1416   LABBENZ NEGATIVE 08/01/2024 1416   AMPHETMU NEGATIVE 08/01/2024 1416   THCU NEGATIVE 08/01/2024 1416   LABBARB NEGATIVE 08/01/2024 1416    Recent Labs  Lab 08/01/24 1416  ETH <15    I have personally reviewed the radiological images below and agree with the radiology interpretations.  ECHOCARDIOGRAM COMPLETE Result Date: 08/02/2024    ECHOCARDIOGRAM REPORT   Patient Name:   Kristi Bender Date of Exam: 08/02/2024 Medical Rec #:  [id number]      Height:       62.0 in Accession #:    [id number]     Weight:       127.9 lb Date of Birth:  1941/03/10       BSA:          1.581 m Patient Age:    83 years       BP:           120/65 mmHg Patient Gender: F              HR:  71 bpm. Exam Location:  Inpatient Procedure: 2D Echo, Cardiac Doppler, Color Doppler and Strain Analysis (Both            Spectral and Color Flow Doppler were utilized during procedure). Indications:    Hypertension, Hyperlipidemia, Transient ischemic attack.  History:        Patient has no prior history of Echocardiogram examinations.  Sonographer:    BERNARDA ROCKS Referring Phys: [id number] VISHAL R PATEL IMPRESSIONS  1. Left ventricular ejection fraction, by estimation, is 60 to 65%. The left ventricle has normal function. The left ventricle has no regional wall motion abnormalities. Left ventricular diastolic parameters were normal. The average left ventricular global longitudinal strain is -22.0 %. The global longitudinal strain is normal.  2. Right ventricular systolic function is normal. The right ventricular size is normal. There is mildly elevated pulmonary artery systolic pressure. The estimated right ventricular systolic pressure is 37.6 mmHg.  3. The mitral valve is normal in structure. Trivial mitral valve regurgitation. No evidence of mitral stenosis.  4. Tricuspid valve regurgitation is  mild to moderate.  5. The aortic valve is normal in structure. Aortic valve regurgitation is trivial. Aortic valve sclerosis is present, with no evidence of aortic valve stenosis.  6. The inferior vena cava is normal in size with greater than 50% respiratory variability, suggesting right atrial pressure of 3 mmHg. FINDINGS  Left Ventricle: Left ventricular ejection fraction, by estimation, is 60 to 65%. The left ventricle has normal function. The left ventricle has no regional wall motion abnormalities. The average left ventricular global longitudinal strain is -22.0 %. Strain was performed and the global longitudinal strain is normal. The left ventricular internal cavity size was normal in size. There is no left ventricular hypertrophy. Left ventricular diastolic parameters were normal. Right Ventricle: The right ventricular size is normal. No increase in right ventricular wall thickness. Right ventricular systolic function is normal. There is mildly elevated pulmonary artery systolic pressure. The tricuspid regurgitant velocity is 2.94  m/s, and with an assumed right atrial pressure of 3 mmHg, the estimated right ventricular systolic pressure is 37.6 mmHg. Left Atrium: Left atrial size was normal in size. Right Atrium: Right atrial size was normal in size. Pericardium: There is no evidence of pericardial effusion. Mitral Valve: The mitral valve is normal in structure. Trivial mitral valve regurgitation. No evidence of mitral valve stenosis. MV peak gradient, 4.2 mmHg. The mean mitral valve gradient is 2.0 mmHg. Tricuspid Valve: The tricuspid valve is normal in structure. Tricuspid valve regurgitation is mild to moderate. No evidence of tricuspid stenosis. Aortic Valve: The aortic valve is normal in structure. Aortic valve regurgitation is trivial. Aortic regurgitation PHT measures 495 msec. Aortic valve sclerosis is present, with no evidence of aortic valve stenosis. Aortic valve mean gradient measures 4.0 mmHg.  Aortic valve peak gradient measures 8.1 mmHg. Aortic valve area, by VTI measures 2.31 cm. Pulmonic Valve: The pulmonic valve was normal in structure. Pulmonic valve regurgitation is not visualized. No evidence of pulmonic stenosis. Aorta: The aortic root is normal in size and structure. Venous: The inferior vena cava is normal in size with greater than 50% respiratory variability, suggesting right atrial pressure of 3 mmHg. IAS/Shunts: No atrial level shunt detected by color flow Doppler.  LEFT VENTRICLE PLAX 2D LVIDd:         4.70 cm      Diastology LVIDs:         2.90 cm      LV e' medial:  9.25 cm/s LV PW:         0.70 cm      LV E/e' medial:  10.5 LV IVS:        0.80 cm      LV e' lateral:   8.49 cm/s LVOT diam:     1.80 cm      LV E/e' lateral: 11.5 LV SV:         77 LV SV Index:   49           2D Longitudinal Strain LVOT Area:     2.54 cm     2D Strain GLS Avg:     -22.0 %  LV Volumes (MOD) LV vol d, MOD A2C: 82.5 ml LV vol d, MOD A4C: 114.0 ml LV vol s, MOD A2C: 30.2 ml LV vol s, MOD A4C: 33.1 ml LV SV MOD A2C:     52.3 ml LV SV MOD A4C:     114.0 ml LV SV MOD BP:      65.6 ml RIGHT VENTRICLE             IVC RV Basal diam:  3.30 cm     IVC diam: 1.40 cm RV S prime:     16.00 cm/s TAPSE (M-mode): 2.4 cm RVSP:           37.6 mmHg LEFT ATRIUM             Index        RIGHT ATRIUM           Index LA diam:        2.30 cm 1.46 cm/m   RA Pressure: 3.00 mmHg LA Vol (A2C):   56.1 ml 35.49 ml/m  RA Area:     17.50 cm LA Vol (A4C):   47.8 ml 30.24 ml/m  RA Volume:   45.80 ml  28.97 ml/m LA Biplane Vol: 57.1 ml 36.12 ml/m  AORTIC VALVE                    PULMONIC VALVE AV Area (Vmax):    2.47 cm     PV Vmax:          0.82 m/s AV Area (Vmean):   2.01 cm     PV Peak grad:     2.7 mmHg AV Area (VTI):     2.31 cm     PR End Diast Vel: 1.26 msec AV Vmax:           142.00 cm/s AV Vmean:          98.100 cm/s AV VTI:            0.335 m AV Peak Grad:      8.1 mmHg AV Mean Grad:      4.0 mmHg LVOT Vmax:         138.00  cm/s LVOT Vmean:        77.400 cm/s LVOT VTI:          0.304 m LVOT/AV VTI ratio: 0.91 AI PHT:            495 msec  AORTA Ao Root diam: 3.50 cm Ao Asc diam:  3.60 cm MITRAL VALVE               TRICUSPID VALVE MV Area (PHT): 2.63 cm    TR Peak grad:   34.6 mmHg MV Area VTI:   2.42 cm    TR Vmax:  294.00 cm/s MV Peak grad:  4.2 mmHg    Estimated RAP:  3.00 mmHg MV Mean grad:  2.0 mmHg    RVSP:           37.6 mmHg MV Vmax:       1.03 m/s MV Vmean:      56.3 cm/s   SHUNTS MV Decel Time: 288 msec    Systemic VTI:  0.30 m MR Peak grad: 54.2 mmHg    Systemic Diam: 1.80 cm MR Vmax:      368.00 cm/s MV E velocity: 97.40 cm/s MV A velocity: 84.80 cm/s MV E/A ratio:  1.15 Mihai Croitoru MD Electronically signed by Jerel Balding MD Signature Date/Time: 08/02/2024/2:08:44 PM    Final    MR BRAIN WO CONTRAST Result Date: 08/02/2024 CLINICAL DATA:  Initial evaluation for acute neuro deficit, stroke suspected. EXAM: MRI HEAD WITHOUT CONTRAST TECHNIQUE: Multiplanar, multiecho pulse sequences of the brain and surrounding structures were obtained without intravenous contrast. COMPARISON:  CTs from 08/01/2024 FINDINGS: Brain: Cerebral volume within normal limits. No significant cerebral white matter disease for age. No evidence for acute or subacute infarct. No areas of chronic cortical infarction. No acute or chronic intracranial blood products. No mass lesion, midline shift or mass effect. No hydrocephalus or extra-axial fluid collection. Pituitary gland within normal limits. Vascular: Major intracranial vascular flow voids are maintained. Skull and upper cervical spine: Craniocervical junction within normal limits. Bone marrow signal intensity overall within normal limits. No scalp soft tissue abnormality. Sinuses/Orbits: Prior bilateral ocular lens replacement. Paranasal sinuses are largely clear. Trace left mastoid effusion noted, of doubtful significance. Other: None. IMPRESSION: Normal brain MRI for age. No acute  intracranial abnormality identified. Electronically Signed   By: Morene Hoard M.D.   On: 08/02/2024 03:20   CT ANGIO HEAD NECK W WO CM Result Date: 08/01/2024 EXAM: CTA HEAD AND NECK WITH AND WITHOUT 08/01/2024 03:15:11 PM TECHNIQUE: CTA of the head and neck was performed with and without the administration of 75 mL of iohexol (OMNIPAQUE) 350 MG/ML injection. Multiplanar 2D and/or 3D reformatted images are provided for review. Automated exposure control, iterative reconstruction, and/or weight based adjustment of the mA/kV was utilized to reduce the radiation dose to as low as reasonably achievable. Stenosis of the internal carotid arteries measured using NASCET criteria. COMPARISON: Same day CT head. CLINICAL HISTORY: Neuro deficit, acute, stroke suspected. Patient states last night she had partial vision loss to left eye. States vision returned and she went to the eye doctor who recommended she come to the ER for stroke work up. No current symptoms. Non con head completed earlier. FINDINGS: CTA NECK: AORTIC ARCH AND ARCH VESSELS: Atherosclerosis of the visualized aortic arch. Common origin of the brachiocephalic and left common carotid arteries. Atherosclerosis of the proximal left subclavian artery without stenosis. No dissection or arterial injury. CERVICAL CAROTID ARTERIES: No dissection, arterial injury, or hemodynamically significant stenosis by NASCET criteria. CERVICAL VERTEBRAL ARTERIES: There is mild tortuosity of the V1 segments of the bilateral vertebral arteries. The vertebral arteries are patent from the origins to the vertebrobasilar confluence. No dissection, arterial injury, or significant stenosis. LUNGS AND MEDIASTINUM: Unremarkable. SOFT TISSUES: No acute abnormality. BONES: Degenerative changes throughout the visualized spine. No acute abnormality. CTA HEAD: ANTERIOR CIRCULATION: The intracranial internal carotid arteries are patent bilaterally. There is mild atherosclerosis of the  carotid siphons without stenosis. The middle cerebral arteries are patent bilaterally. The anterior cerebral arteries are patent bilaterally. No aneurysm. POSTERIOR CIRCULATION: Fetal origin of the  left PCA. No significant stenosis of the posterior cerebral arteries. No significant stenosis of the basilar artery. No significant stenosis of the vertebral arteries. No aneurysm. OTHER: No dural venous sinus thrombosis on this non-dedicated study. IMPRESSION: 1. No large vessel occlusion, hemodynamically significant stenosis, or aneurysm in the head or neck. Electronically signed by: Donnice Mania MD 08/01/2024 03:58 PM EDT RP Workstation: [id number]   CT Head Wo Contrast Result Date: 08/01/2024 EXAM: CT HEAD WITHOUT CONTRAST 08/01/2024 02:26:21 PM TECHNIQUE: CT of the head was performed without the administration of intravenous contrast. Automated exposure control, iterative reconstruction, and/or weight based adjustment of the mA/kV was utilized to reduce the radiation dose to as low as reasonably achievable. COMPARISON: None available. CLINICAL HISTORY: Neuro deficit, acute, stroke suspected. Patient states last night she had partial vision loss to left eye. States vision returned and she went to the eye doctor who recommended she come to the ER for stroke work up. No current symptoms. FINDINGS: BRAIN AND VENTRICLES: No acute hemorrhage. No evidence of acute infarct. No hydrocephalus. No extra-axial collection. No mass effect or midline shift. ORBITS: Bilateral lens replacement. SINUSES: No acute abnormality. SOFT TISSUES AND SKULL: No acute soft tissue abnormality. No skull fracture. IMPRESSION: 1. No acute intracranial abnormality. Electronically signed by: Donnice Mania MD 08/01/2024 02:33 PM EDT RP Workstation: [id number]     PHYSICAL EXAM  Temp:  [97.6 F (36.4 C)-98.5 F (36.9 C)] 97.9 F (36.6 C) (10/01 1130) Pulse Rate:  [59-78] 74 (10/01 1130) Resp:  [16-18] 18 (10/01 1130) BP:  (110-134)/(60-100) 110/66 (10/01 1130) SpO2:  [96 %-100 %] 100 % (10/01 1130) Weight:  [58 kg] 58 kg (09/30 1938)  General - Well nourished, well developed, in no apparent distress.  Ophthalmologic - fundi not visualized due to noncooperation.  Cardiovascular - Regular rhythm and rate.  Mental Status -  Level of arousal and orientation to time, place, and person were intact. Language including expression, naming, repetition, comprehension was assessed and found intact. Attention span and concentration were normal. Fund of Knowledge was assessed and was intact.  Cranial Nerves II - XII - II - Visual field intact OU. III, IV, VI - Extraocular movements intact. V - Facial sensation intact bilaterally. VII - Facial movement intact bilaterally. VIII - Hearing & vestibular intact bilaterally. X - Palate elevates symmetrically. XI - Chin turning & shoulder shrug intact bilaterally. XII - Tongue protrusion intact.  Motor Strength - The patient's strength was normal in all extremities and pronator drift was absent.  Bulk was normal and fasciculations were absent.   Motor Tone - Muscle tone was assessed at the neck and appendages and was normal.  Reflexes - The patient's reflexes were symmetrical in all extremities and she had no pathological reflexes.  Sensory - Light touch, temperature/pinprick were assessed and were symmetrical.    Coordination - The patient had normal movements in the hands and feet with no ataxia or dysmetria.  Tremor was absent.  Gait and Station - deferred.   ASSESSMENT/PLAN Ms. Kristi Bender is a 83 y.o. female with history of HLD, hypothyroidism and migraine admitted for left eye partial amaurosis fugux. No TNK given due to symptoms resolved.    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  The current event could be ocular migraine, will refer neuro follow up  BP management Stable Long term BP goal normotensive  Hyperlipidemia Home meds:  crestor 5  LDL 80, goal < 70 Now on crestor 10 Continue statin at discharge  Other Stroke Risk Factors Advanced age  Other Active Problems Hypothyroidism - on synthroid  Hospital day # 0  Neurology will sign off. Please call with questions. Pt will follow up with neurology at [facility] in about 4-6 weeks. Thanks for the consult.   Ary Cummins, MD PhD Stroke Neurology 08/02/2024 2:23 PM  I discussed with Dr. Rojelio. I spent additional inpt 30 min face-to-face time with the patient and family, reviewing test results, images and medication, and discussing the diagnosis, treatment plan and potential prognosis. This patient's care requiresreview of multiple databases, neurological assessment, discussion with family, other specialists and medical decision making of high complexity.      To contact Stroke Continuity provider, please refer to [email or web address]. After hours, contact General Neurology

## 2024-08-02 NOTE — Progress Notes (Signed)
 OT Cancellation Note  Patient Details Name: Kristi Bender MRN: [id number] DOB: 1941/02/07   Cancelled Treatment:    Reason Eval/Treat Not Completed: OT screened, has been ambulating indpendently in room, taking care of all self-care. Vision is back to baseline. Able to read up close and far away. No questions or concerns. Can verbally identify signs of stroke. No needs identified, will sign off  Leita JINNY Odea 08/02/2024, 8:51 AM  Leita DEL OTR/L Acute Rehabilitation Services Office: [phone]

## 2024-08-02 NOTE — Discharge Summary (Signed)
 Physician Discharge Summary  Kristi Bender FMW:[id number] DOB: Jun 17, 1941 DOA: 08/01/2024  PCP: Waylan Almarie SAUNDERS, MD  Admit date: 08/01/2024 Discharge date: 08/02/2024  Admitted From: Home Disposition:  Home  Recommendations for Outpatient Follow-up:  Follow up with PCP Follow-up with neurology outpatient Echocardiogram result is pending at time of discharge.  Please follow-up outpatient.  Discharge Condition: Stable CODE STATUS: DNR Diet recommendation:  Diet Orders (From admission, onward)     Start     Ordered   08/02/24 0000  Diet - low sodium heart healthy        08/02/24 1241   08/01/24 2023  Diet Heart Room service appropriate? Yes; Fluid consistency: Thin  Diet effective now       Question Answer Comment  Room service appropriate? Yes   Fluid consistency: Thin      08/01/24 2022           Brief/Interim Summary: Kristi Bender is a 83 y.o. female with medical history significant for HTN, HLD, hypothyroidism who presented to the ED for evaluation of transient partial left vision loss at the advice of her ophthalmologist.   Patient states that she was sitting down watching TV and working on her laptop last night when she suddenly developed loss of vision in the left lower quadrant of her left eye.  She says it was like a black curtain covering that part of her visual field.  She says this resolved within 5 minutes.   She saw her ophthalmologist Dr. Waylan this morning.  Funduscopic exam was normal per patient.  He was concerned about TIA and recommended that she come to the hospital for further evaluation.   Patient denies any associated headache, chest pain, palpitations.  She denies any similar episode of vision loss in the past.  She has not had any weakness in her extremities or new numbness/tingling.   She says she was recently started on valsartan in August for blood pressure.  Her other medications are rosuvastatin and Synthroid.     [facility] ED  Course  Labs/Imaging on admission:   Initial vitals showed BP 139/74, pulse 87, RR 16, temp 99.7 F, SpO2 98% on room air.   Labs showed WBC 7.0, hemoglobin 12.3, platelets 220, sodium 138, potassium 3.8, bicarb 24, BUN 12, creatinine 0.82, serum glucose 101, LFTs within normal limits, UA negative for UTI.  UDS negative.  Serum ethanol <15.   CT head without contrast negative for acute or cranial abnormality.   CTA head/neck negative for LVO, hemodynamically significant stenosis, or aneurysm.   EDP discussed with neurology, Dr. Voncile, who recommended medical admission to [facility] for stroke/TIA workup.  They will see in consultation.  The hospitalist service was consulted for admission.   Patient underwent stroke workup which was negative.  Neurology felt this was perhaps ocular migraine versus amaurosis fugax but elected to treat as the latter.  She will be on aspirin and Plavix for 3 weeks, then aspirin alone as well as Crestor.  She will follow-up with neurology outpatient.  Discharge Diagnoses:   Principal Problem:   Amaurosis fugax Active Problems:   Essential hypertension   Hyperlipidemia   Hypothyroidism    Discharge Instructions  Discharge Instructions     Ambulatory referral to Neurology   Complete by: As directed    Follow up with stroke clinic NP at [facility] in about 4-6 weeks. Thanks.   Call MD for:  difficulty breathing, headache or visual disturbances   Complete  by: As directed    Call MD for:  extreme fatigue   Complete by: As directed    Call MD for:  hives   Complete by: As directed    Call MD for:  persistant dizziness or light-headedness   Complete by: As directed    Call MD for:  persistant nausea and vomiting   Complete by: As directed    Call MD for:  severe uncontrolled pain   Complete by: As directed    Call MD for:  temperature >100.4   Complete by: As directed    Diet - low sodium heart healthy   Complete by: As directed    Discharge  instructions   Complete by: As directed    You were cared for by a hospitalist during your hospital stay. If you have any questions about your discharge medications or the care you received while you were in the hospital after you are discharged, you can call the unit and ask to speak with the hospitalist on call if the hospitalist that took care of you is not available. Once you are discharged, your primary care physician will handle any further medical issues. Please note that NO REFILLS for any discharge medications will be authorized once you are discharged, as it is imperative that you return to your primary care physician (or establish a relationship with a primary care physician if you do not have one) for your aftercare needs so that they can reassess your need for medications and monitor your lab values.   Increase activity slowly   Complete by: As directed       Allergies as of 08/02/2024   No Known Allergies      Medication List     STOP taking these medications    ibuprofen 200 MG tablet Commonly known as: ADVIL       TAKE these medications    aspirin EC 81 MG tablet Take 1 tablet (81 mg total) by mouth daily. Swallow whole. Start taking on: August 03, 2024   clopidogrel 75 MG tablet Commonly known as: PLAVIX Take 1 tablet (75 mg total) by mouth daily for 21 days.   gabapentin 100 MG capsule Commonly known as: NEURONTIN Take 100 mg by mouth 2 (two) times daily as needed (for pain).   levothyroxine 88 MCG tablet Commonly known as: SYNTHROID   rosuvastatin 10 MG tablet Commonly known as: CRESTOR Take 1 tablet (10 mg total) by mouth every evening. What changed:  medication strength how much to take   valsartan 40 MG tablet Commonly known as: DIOVAN Take 40 mg by mouth every evening.        Follow-up Information     [facility] Follow up in 1 month(s).   Specialty: Neurology Why: stroke clinic Contact information: [address] [place]  [address] [phone]        Waylan Almarie SAUNDERS, MD Follow up.   Specialty: Family Medicine Why: Echocardiogram result is pending at time of discharge.  Please follow-up outpatient. Contact information: [address] [place] [address] [phone]                No Known Allergies  Consultations: Neurology   Procedures/Studies: MR BRAIN WO CONTRAST Result Date: 08/02/2024 CLINICAL DATA:  Initial evaluation for acute neuro deficit, stroke suspected. EXAM: MRI HEAD WITHOUT CONTRAST TECHNIQUE: Multiplanar, multiecho pulse sequences of the brain and surrounding structures were obtained without intravenous contrast. COMPARISON:  CTs from 08/01/2024  FINDINGS: Brain: Cerebral volume within normal limits. No significant cerebral white matter disease for age. No evidence for acute or subacute infarct. No areas of chronic cortical infarction. No acute or chronic intracranial blood products. No mass lesion, midline shift or mass effect. No hydrocephalus or extra-axial fluid collection. Pituitary gland within normal limits. Vascular: Major intracranial vascular flow voids are maintained. Skull and upper cervical spine: Craniocervical junction within normal limits. Bone marrow signal intensity overall within normal limits. No scalp soft tissue abnormality. Sinuses/Orbits: Prior bilateral ocular lens replacement. Paranasal sinuses are largely clear. Trace left mastoid effusion noted, of doubtful significance. Other: None. IMPRESSION: Normal brain MRI for age. No acute intracranial abnormality identified. Electronically Signed   By: Morene Hoard M.D.   On: 08/02/2024 03:20   CT ANGIO HEAD NECK W WO CM Result Date: 08/01/2024 EXAM: CTA HEAD AND NECK WITH AND WITHOUT 08/01/2024 03:15:11 PM TECHNIQUE: CTA of the head and neck was performed with and without the administration of 75 mL of iohexol (OMNIPAQUE) 350 MG/ML injection. Multiplanar 2D  and/or 3D reformatted images are provided for review. Automated exposure control, iterative reconstruction, and/or weight based adjustment of the mA/kV was utilized to reduce the radiation dose to as low as reasonably achievable. Stenosis of the internal carotid arteries measured using NASCET criteria. COMPARISON: Same day CT head. CLINICAL HISTORY: Neuro deficit, acute, stroke suspected. Patient states last night she had partial vision loss to left eye. States vision returned and she went to the eye doctor who recommended she come to the ER for stroke work up. No current symptoms. Non con head completed earlier. FINDINGS: CTA NECK: AORTIC ARCH AND ARCH VESSELS: Atherosclerosis of the visualized aortic arch. Common origin of the brachiocephalic and left common carotid arteries. Atherosclerosis of the proximal left subclavian artery without stenosis. No dissection or arterial injury. CERVICAL CAROTID ARTERIES: No dissection, arterial injury, or hemodynamically significant stenosis by NASCET criteria. CERVICAL VERTEBRAL ARTERIES: There is mild tortuosity of the V1 segments of the bilateral vertebral arteries. The vertebral arteries are patent from the origins to the vertebrobasilar confluence. No dissection, arterial injury, or significant stenosis. LUNGS AND MEDIASTINUM: Unremarkable. SOFT TISSUES: No acute abnormality. BONES: Degenerative changes throughout the visualized spine. No acute abnormality. CTA HEAD: ANTERIOR CIRCULATION: The intracranial internal carotid arteries are patent bilaterally. There is mild atherosclerosis of the carotid siphons without stenosis. The middle cerebral arteries are patent bilaterally. The anterior cerebral arteries are patent bilaterally. No aneurysm. POSTERIOR CIRCULATION: Fetal origin of the left PCA. No significant stenosis of the posterior cerebral arteries. No significant stenosis of the basilar artery. No significant stenosis of the vertebral arteries. No aneurysm. OTHER: No  dural venous sinus thrombosis on this non-dedicated study. IMPRESSION: 1. No large vessel occlusion, hemodynamically significant stenosis, or aneurysm in the head or neck. Electronically signed by: Donnice Mania MD 08/01/2024 03:58 PM EDT RP Workstation: [id number]   CT Head Wo Contrast Result Date: 08/01/2024 EXAM: CT HEAD WITHOUT CONTRAST 08/01/2024 02:26:21 PM TECHNIQUE: CT of the head was performed without the administration of intravenous contrast. Automated exposure control, iterative reconstruction, and/or weight based adjustment of the mA/kV was utilized to reduce the radiation dose to as low as reasonably achievable. COMPARISON: None available. CLINICAL HISTORY: Neuro deficit, acute, stroke suspected. Patient states last night she had partial vision loss to left eye. States vision returned and she went to the eye doctor who recommended she come to the ER for stroke work up. No current symptoms. FINDINGS: BRAIN AND VENTRICLES: No acute hemorrhage.  No evidence of acute infarct. No hydrocephalus. No extra-axial collection. No mass effect or midline shift. ORBITS: Bilateral lens replacement. SINUSES: No acute abnormality. SOFT TISSUES AND SKULL: No acute soft tissue abnormality. No skull fracture. IMPRESSION: 1. No acute intracranial abnormality. Electronically signed by: Donnice Mania MD 08/01/2024 02:33 PM EDT RP Workstation: [id number]       Discharge Exam: Vitals:   08/02/24 0804 08/02/24 1130  BP: 134/78 110/66  Pulse: 74 74  Resp: 18 18  Temp: 98.5 F (36.9 C) 97.9 F (36.6 C)  SpO2: 99% 100%    General: Pt is alert, awake, not in acute distress Cardiovascular: RRR, S1/S2 +, no edema, +2 systolic murmur  Respiratory: CTA bilaterally, no wheezing, no rhonchi, no respiratory distress, no conversational dyspnea  Abdominal: Soft, NT, ND, bowel sounds + Extremities: no edema, no cyanosis Psych: Normal mood and affect, stable judgement and insight     The results of significant  diagnostics from this hospitalization (including imaging, microbiology, ancillary and laboratory) are listed below for reference.     Microbiology: No results found for this or any previous visit (from the past 240 hours).   Labs: BNP (last 3 results) No results for input(s): BNP in the last 8760 hours. Basic Metabolic Panel: Recent Labs  Lab 08/01/24 1416 08/02/24 0454  NA 138 140  K 3.8 4.1  CL 102 104  CO2 24 26  GLUCOSE 101* 95  BUN 12 9  CREATININE 0.82 0.80  CALCIUM 9.7 9.3   Liver Function Tests: Recent Labs  Lab 08/01/24 1416  AST 22  ALT 18  ALKPHOS 77  BILITOT 0.5  PROT 6.7  ALBUMIN 4.3   No results for input(s): LIPASE, AMYLASE in the last 168 hours. No results for input(s): AMMONIA in the last 168 hours. CBC: Recent Labs  Lab 08/01/24 1416 08/02/24 0454  WBC 7.0 5.9  NEUTROABS 4.8  --   HGB 12.3 12.3  HCT 36.2 35.5*  MCV 96.8 95.7  PLT 220 222   Cardiac Enzymes: No results for input(s): CKTOTAL, CKMB, CKMBINDEX, TROPONINI in the last 168 hours. BNP: Invalid input(s): POCBNP CBG: No results for input(s): GLUCAP in the last 168 hours. D-Dimer No results for input(s): DDIMER in the last 72 hours. Hgb A1c Recent Labs    08/02/24 0454  HGBA1C 5.4   Lipid Profile Recent Labs    08/02/24 0454  CHOL 150  HDL 63  LDLCALC 80  TRIG 34  CHOLHDL 2.4   Thyroid  function studies No results for input(s): TSH, T4TOTAL, T3FREE, THYROIDAB in the last 72 hours.  Invalid input(s): FREET3 Anemia work up No results for input(s): VITAMINB12, FOLATE, FERRITIN, TIBC, IRON, RETICCTPCT in the last 72 hours. Urinalysis    Component Value Date/Time   COLORURINE COLORLESS (A) 08/01/2024 1416   APPEARANCEUR CLEAR 08/01/2024 1416   LABSPEC <1.005 (L) 08/01/2024 1416   PHURINE 6.5 08/01/2024 1416   GLUCOSEU NEGATIVE 08/01/2024 1416   HGBUR NEGATIVE 08/01/2024 1416   BILIRUBINUR NEGATIVE 08/01/2024 1416    KETONESUR NEGATIVE 08/01/2024 1416   PROTEINUR NEGATIVE 08/01/2024 1416   NITRITE NEGATIVE 08/01/2024 1416   LEUKOCYTESUR TRACE (A) 08/01/2024 1416   Sepsis Labs Recent Labs  Lab 08/01/24 1416 08/02/24 0454  WBC 7.0 5.9   Microbiology No results found for this or any previous visit (from the past 240 hours).   Patient was seen and examined on the day of discharge and was found to be in stable condition. Time coordinating discharge: 35 minutes including  assessment and coordination of care, as well as examination of the patient.   SIGNED:  Delon Hoe, DO [facility] 08/02/2024, 12:41 PM

## 2024-08-02 NOTE — Progress Notes (Signed)
 SLP Cancellation Note  Patient Details Name: Kristi Bender MRN: [id number] DOB: 1940/12/31   Cancelled treatment:       Reason Eval/Treat Not Completed: SLP screened, no needs identified, will sign off. Pt is Ox4 with clear, fluent, and intelligible speech. Pt denied concerns for acute cognitive-linguistic changes and politely declined the need for a formal cognitive assessment. SLP in agreement.    [name] 08/02/2024, 9:09 AM

## 2024-08-02 NOTE — Progress Notes (Signed)
 PT Cancellation Note  Patient Details Name: Kristi Bender MRN: [id number] DOB: Oct 27, 1941   Cancelled Treatment:    Reason Eval/Treat Not Completed: PT screened, no needs identified, will sign off. Pt has been ambulating independently with no questions or concerns. Please re-consult if new needs arise.  Kate ORN, PT, DPT Secure [facility] [phone]   Kate BRAVO Wendolyn 08/02/2024, 9:34 AM

## 2024-08-02 NOTE — Consult Note (Addendum)
 NEUROLOGY CONSULT NOTE   Date of service: August 02, 2024 Patient Name: Kristi Bender MRN:  [id number] DOB:  Aug 08, 1941 Chief Complaint: Transient left eye visual disturbance Requesting Provider: Tobie Jorie SAUNDERS, MD  History of Present Illness  Kristi Bender is a 83 y.o. female with hx of HTN (newly diagnosed), HLD, hypothyroid, who presents after transient episode of visual disturbance.   On 9/29 evening, patient was working on her laptop when she noticed a black curtain drawing over her left vision. She believes it was the left eye only, however did not check each eye individually. Within a few minutes, the blackness became gray and gradually disappeared. She saw her ophthalmologist who reported normal dilated fundoscopic exam and referred to ED. CTH and CTA at [facility] ED was unrevealing. There was only mild atherosclerosis of the carotid siphons. She was then transferred to [facility] for MRI and TIA/stroke work-up.  Of note, she reports that for the last year, she has had episodes in which she will see something shimmering. She believes this is in both eyes, however did not test each individually.  Denies headache with this episode or any prior episodes of visual disturbance.   LKW: 9/29 evening Modified rankin score: 0-Completely asymptomatic and back to baseline post- stroke IV Thrombolysis: No, out of window EVT: No, out of window, too mild to treat ICH Score: N/A  NIHSS components Score: Comment  1a Level of Conscious 0[x]  1[]  2[]  3[]      1b LOC Questions 0[x]  1[]  2[]       1c LOC Commands 0[x]  1[]  2[]       2 Best Gaze 0[x]  1[]  2[]       3 Visual 0[x]  1[]  2[]  3[]      4 Facial Palsy 0[x]  1[]  2[]  3[]      5a Motor Arm - left 0[x]  1[]  2[]  3[]  4[]  UN[]    5b Motor Arm - Right 0[x]  1[]  2[]  3[]  4[]  UN[]    6a Motor Leg - Left 0[x]  1[]  2[]  3[]  4[]  UN[]    6b Motor Leg - Right 0[x]  1[]  2[]  3[]  4[]  UN[]    7 Limb Ataxia 0[x]  1[]  2[]  UN[]      8 Sensory 0[x]  1[]  2[]  UN[]      9 Best  Language 0[x]  1[]  2[]  3[]      10 Dysarthria 0[x]  1[]  2[]  UN[]      11 Extinct. and Inattention 0[x]  1[]  2[]       TOTAL: 0      ROS  Comprehensive ROS performed and pertinent positives documented in HPI   Past History   Past Medical History:  Diagnosis Date   Essential hypertension 08/01/2024   Hyperlipidemia 08/01/2024   Hypothyroidism 08/01/2024    No past surgical history on file.  Family History: No family history on file.  Social History  reports that she has never smoked. She has never used smokeless tobacco. No history on file for alcohol  use and drug use.  No Known Allergies  Medications   Current Facility-Administered Medications:     stroke: early stages of recovery book, , Does not apply, Once, Patel, Vishal R, MD   acetaminophen (TYLENOL) tablet 650 mg, 650 mg, Oral, Q4H PRN **OR** acetaminophen (TYLENOL) 160 MG/5ML solution 650 mg, 650 mg, Per Tube, Q4H PRN **OR** acetaminophen (TYLENOL) suppository 650 mg, 650 mg, Rectal, Q4H PRN, Patel, Vishal R, MD   aspirin EC tablet 81 mg, 81 mg, Oral, Daily, Patel, Vishal R, MD   [COMPLETED] clopidogrel (PLAVIX) tablet 300 mg, 300 mg, Oral,  Once, 300 mg at 08/02/24 0130 **FOLLOWED BY** [START ON 08/03/2024] clopidogrel (PLAVIX) tablet 75 mg, 75 mg, Oral, Daily, Reema Chick M, MD   enoxaparin (LOVENOX) injection 40 mg, 40 mg, Subcutaneous, Q24H, Patel, Vishal R, MD   levothyroxine (SYNTHROID) tablet 88 mcg, 88 mcg, Oral, Q0600, Patel, Vishal R, MD   ondansetron  (ZOFRAN ) injection 4 mg, 4 mg, Intravenous, Q6H PRN, Tobie, Vishal R, MD   rosuvastatin (CRESTOR) tablet 20 mg, 20 mg, Oral, QPM, Patel, Vishal R, MD   senna-docusate (Senokot-S) tablet 1 tablet, 1 tablet, Oral, QHS PRN, Tobie Jorie SAUNDERS, MD  Vitals   Vitals:   08-25-24 1820 2024-08-25 1938 08/02/24 0024 08/02/24 0354  BP:  (!) 124/100 129/60 120/65  Pulse:  78 63 (!) 59  Resp:  17 17 16   Temp: 98.3 F (36.8 C) 97.6 F (36.4 C) 98.3 F (36.8 C) 97.8 F (36.6 C)   TempSrc: Oral Oral Oral   SpO2:  100% 97% 96%  Weight:  58 kg    Height:  5' 2 (1.575 m)      Body mass index is 23.39 kg/m.   Physical Exam   Constitutional: Appears well-developed and well-nourished.  Psych: Affect appropriate to situation.  Eyes: No scleral injection.  HENT: No OP obstruction.  Head: Normocephalic.  Cardiovascular: Normal rate and regular rhythm.  Respiratory: Effort normal, non-labored breathing.  GI: Soft.  No distension. There is no tenderness.  Skin: WDI.   Neurologic Examination   Mental status: alert, oriented to person, place and time. Able to provide history. Speech: no dysarthria, word-finding difficulty, paraphasic errors. Cranial nerves: PERRL EOMI VF full Face sensation intact bilaterally. Face symmetric at rest and with activation. Hearing grossly intact. Palate elevation symmetric Tongue protrudes midline and has full range of motion. SCM's full strength bilaterally. Motor: Normal bulk and tone. No abnormal movements RUE: shoulder abduction 5/5, biceps 5/5, triceps 5/5, wrist flexion 5/5, wrist extension 5/5, hand grip 5/5 LUE: shoulder abduction 5/5, biceps 5/5, triceps 5/5, wrist flexion 5/5, wrist extension 5/5, hand grip 5/5 RLE: hip flexion 5/5, knee flexion 5/5, knee extension 5/5, ankle dorsiflexion 5/5, plantar flexion 5/5 LLE: hip flexion 5/5, knee flexion 5/5, knee extension 5/5, ankle dorsiflexion 5/5, plantar flexion 5/5 Sensory: Grossly intact to light touch throughout. Reflexes: DTR's 2+ throughout. Downgoing toes bilaterally. Coordination: FTN intact. HTS intact. Gait: deferred   Labs/Imaging/Neurodiagnostic studies   CBC:  Recent Labs  Lab 08/25/24 1416 08/02/24 0454  WBC 7.0 5.9  NEUTROABS 4.8  --   HGB 12.3 12.3  HCT 36.2 35.5*  MCV 96.8 95.7  PLT 220 222   Basic Metabolic Panel:  Lab Results  Component Value Date   NA 138 08-25-2024   K 3.8 August 25, 2024   CO2 24 08-25-24   GLUCOSE 101 (H)  Aug 25, 2024   BUN 12 08-25-24   CREATININE 0.82 08-25-24   CALCIUM 9.7 25-Aug-2024   GFRNONAA >60 08/25/24   Lipid Panel: No results found for: LDLCALC HgbA1c:  Lab Results  Component Value Date   HGBA1C 5.4 08/02/2024   Urine Drug Screen:     Component Value Date/Time   LABOPIA NEGATIVE 08-25-2024 1416   COCAINSCRNUR NEGATIVE 08-25-24 1416   LABBENZ NEGATIVE 08/25/2024 1416   AMPHETMU NEGATIVE 2024-08-25 1416   THCU NEGATIVE Aug 25, 2024 1416   LABBARB NEGATIVE August 25, 2024 1416    Alcohol  Level     Component Value Date/Time   [facility] <15 25-Aug-2024 1416   INR  Lab Results  Component Value Date  INR 0.9 08/01/2024   APTT  Lab Results  Component Value Date   APTT 30 08/01/2024   AED levels: No results found for: PHENYTOIN, ZONISAMIDE, LAMOTRIGINE, LEVETIRACETA  CT Head without contrast(Personally reviewed): No acute abnormality.  CT angio Head and Neck with contrast(Personally reviewed): No LVO. No significant stenosis, dissection, or aneurysm. Mild atherosclerosis of bilateral carotid siphons.    ASSESSMENT   ALAYCIA EARDLEY is a 83 y.o. female with hx of HTN (newly diagnosed), HLD, hypothyroid, who presents after transient episode of visual disturbance. If accurate that only left eye was involved, then consistent with amaurosis fugax, however CTA without any significant carotid stenosis. Could possibly have been left visual field of both eyes as well as she did not test each eye in isolation, and therefore could be posterior circulatory stroke. Reassuringly no symptoms at the moment.  Stroke risk factors: age, HLD, HTN (although only newly diagnosed and was normal before)   RECOMMENDATIONS  Cerebrovascular\Neuro  TIA of L eye vs R occipital Etiology:  TBD  CT head with no acute abnormality.  CTA head & neck with no significant stenosis, dissection, or aneurysm. MRI  ordered 2D Echo ordered LDL ordered HgbA1c 5.4 VTE prophylaxis - Enoxaparin     Diet   Diet Heart Room service appropriate? Yes; Fluid consistency: Thin   No antithrombotic prior to admission, now on ASA 81mg  lifelong and Plavix (300mg  today followed by 20 days of 75mg ).  Therapy recommendations:  TBD Disposition:  TBD  Hypertension Home meds:  Valsartan 40mg  (started 06/13/24) Permissive hypertension (OK if < 220/120) but gradually normalize in 5-7 days Long-term BP goal normotensive  Hyperlipidemia Home meds:  Rosuvastatin 5mg  LDL ordered, goal < 70 OK to be moderate intensity considering her age Continue statin at discharge  Other Stroke Risk Factors Advanced Age >/= 31    ______________________________________________________________________    Signed, Everline Mahaffy M Arlyss Weathersby, MD [facility]   I personally spent a total of 72 minutes in the care of the patient today including preparing to see the patient, getting/reviewing separately obtained history, performing a medically appropriate exam/evaluation, counseling and educating, referring and communicating with other health care professionals, documenting clinical information in the EHR, independently interpreting results, and communicating results.

## 2024-08-14 DIAGNOSIS — R011 Cardiac murmur, unspecified: Secondary | ICD-10-CM | POA: Diagnosis not present

## 2024-08-14 DIAGNOSIS — I10 Essential (primary) hypertension: Secondary | ICD-10-CM | POA: Diagnosis not present

## 2024-08-14 DIAGNOSIS — G453 Amaurosis fugax: Secondary | ICD-10-CM | POA: Diagnosis not present

## 2024-08-17 DIAGNOSIS — G453 Amaurosis fugax: Secondary | ICD-10-CM | POA: Diagnosis not present

## 2024-08-23 DIAGNOSIS — E782 Mixed hyperlipidemia: Secondary | ICD-10-CM | POA: Diagnosis not present

## 2024-08-23 DIAGNOSIS — I10 Essential (primary) hypertension: Secondary | ICD-10-CM | POA: Diagnosis not present

## 2024-08-23 DIAGNOSIS — E039 Hypothyroidism, unspecified: Secondary | ICD-10-CM | POA: Diagnosis not present

## 2024-08-28 DIAGNOSIS — Z7185 Encounter for immunization safety counseling: Secondary | ICD-10-CM | POA: Diagnosis not present

## 2024-08-28 DIAGNOSIS — E782 Mixed hyperlipidemia: Secondary | ICD-10-CM | POA: Diagnosis not present

## 2024-08-28 DIAGNOSIS — G453 Amaurosis fugax: Secondary | ICD-10-CM | POA: Diagnosis not present

## 2024-08-28 DIAGNOSIS — Z789 Other specified health status: Secondary | ICD-10-CM | POA: Diagnosis not present

## 2024-08-28 DIAGNOSIS — Z79899 Other long term (current) drug therapy: Secondary | ICD-10-CM | POA: Diagnosis not present

## 2024-08-28 DIAGNOSIS — I10 Essential (primary) hypertension: Secondary | ICD-10-CM | POA: Diagnosis not present

## 2024-08-28 DIAGNOSIS — E039 Hypothyroidism, unspecified: Secondary | ICD-10-CM | POA: Diagnosis not present

## 2024-08-28 DIAGNOSIS — Z23 Encounter for immunization: Secondary | ICD-10-CM | POA: Diagnosis not present

## 2024-09-19 DIAGNOSIS — G43109 Migraine with aura, not intractable, without status migrainosus: Secondary | ICD-10-CM | POA: Diagnosis not present

## 2024-09-19 DIAGNOSIS — Z0000 Encounter for general adult medical examination without abnormal findings: Secondary | ICD-10-CM | POA: Diagnosis not present

## 2024-09-19 DIAGNOSIS — Z1331 Encounter for screening for depression: Secondary | ICD-10-CM | POA: Diagnosis not present

## 2024-09-19 DIAGNOSIS — I10 Essential (primary) hypertension: Secondary | ICD-10-CM | POA: Diagnosis not present

## 2024-09-19 DIAGNOSIS — Z1339 Encounter for screening examination for other mental health and behavioral disorders: Secondary | ICD-10-CM | POA: Diagnosis not present

## 2024-09-20 DIAGNOSIS — R011 Cardiac murmur, unspecified: Secondary | ICD-10-CM | POA: Insufficient documentation

## 2024-09-20 NOTE — Progress Notes (Unsigned)
 Cardiology Office Note   Date:  09/21/2024   ID:  Kristi Bender, DOB February 02, 1941, MRN [id number]  PCP:  Kristi Almarie SAUNDERS, MD  Cardiologist:   Kristi Schilling, MD Referring:  Kristi Almarie SAUNDERS, MD  Chief Complaint  Patient presents with   Amaurosis fugax      History of Present Illness: Kristi Bender is a 83 y.o. female who presents for evaluation of a murmur.  She has no past cardiac history.     She recently had an episode of amaurosis fugax and was hospitalized for this.  She saw her neuro-ophthalmologist who thought she might have a migraine but they did do an extensive workup.  I reviewed this.  She had CT with and without contrast which was negative for any acute findings.  MRI was unremarkable.  Carotid Doppler done as an outpatient demonstrated no obstructive disease.  Echocardiography demonstrated some mild aortic sclerosis with a well-preserved ejection fraction.  There were no other significant findings.  She was sent home on aspirin and was to take Plavix for 3 to 4 weeks.  She does very well.  She is extremely active and looks much younger than her age.  She does all of her chores and walks 3 to 4 miles a day.  She takes care of her sister who is living in a facility.  The patient denies any new symptoms such as chest discomfort, neck or arm discomfort. There has been no new shortness of breath, PND or orthopnea. There have been no reported palpitations, presyncope or syncope.   Past Medical History:  Diagnosis Date   Essential hypertension 08/01/2024   H/O total vaginal hysterectomy    Hyperlipidemia 08/01/2024   Hypothyroidism 08/01/2024    Past Surgical History:  Procedure Laterality Date   hysterectomy       Current Outpatient Medications  Medication Sig Dispense Refill   aspirin EC 81 MG tablet Take 1 tablet (81 mg total) by mouth daily. Swallow whole. 30 tablet 2   gabapentin (NEURONTIN) 100 MG capsule Take 100 mg by mouth 2 (two) times daily as needed  (for pain).     levothyroxine (SYNTHROID) 88 MCG tablet      rosuvastatin (CRESTOR) 10 MG tablet Take 1 tablet (10 mg total) by mouth every evening. 30 tablet 2   valsartan (DIOVAN) 40 MG tablet Take 40 mg by mouth every evening.     No current facility-administered medications for this visit.    Allergies:   Patient has no known allergies.    Social History:  The patient  reports that she has never smoked. She has never used smokeless tobacco. She reports that she does not currently use alcohol .   Family History:  The patient's family history includes Cancer in her father; Heart attack (age of onset: 40) in her mother.    ROS:  Please see the history of present illness.   Otherwise, review of systems are positive for none.   All other systems are reviewed and negative.    PHYSICAL EXAM: VS:  BP 132/60 (BP Location: Left Arm, Patient Position: Sitting, Cuff Size: Normal)   Pulse 84   Ht 5' 2 (1.575 m)   Wt 132 lb (59.9 kg)   BMI 24.14 kg/m  , BMI Body mass index is 24.14 kg/m. GENERAL:  Well appearing HEENT:  Pupils equal round and reactive, fundi not visualized, oral mucosa unremarkable NECK:  No jugular venous distention, waveform within normal limits, carotid upstroke brisk and  symmetric, no bruits, no thyromegaly LYMPHATICS:  No cervical, inguinal adenopathy LUNGS:  Clear to auscultation bilaterally BACK:  No CVA tenderness CHEST:  Unremarkable HEART:  PMI not displaced or sustained,S1 and S2 within normal limits, no S3, no S4, no clicks, no rubs, soft brief apical systolic murmur radiating slightly at the aortic outflow tract and early peaking, no diastolic murmurs, no change with Valsalva ABD:  Flat, positive bowel sounds normal in frequency in pitch, no bruits, no rebound, no guarding, no midline pulsatile mass, no hepatomegaly, no splenomegaly EXT:  2 plus pulses throughout, no edema, no cyanosis no clubbing SKIN:  No rashes no nodules NEURO:  Cranial nerves II through  XII grossly intact, motor grossly intact throughout PSYCH:  Cognitively intact, oriented to person place and time    EKG:    Normal sinus rhythm, rate 76, axis within normal limits, intervals within normal limits, no acute ST-T wave changes.   Recent Labs: 08/01/2024: ALT 18 08/02/2024: BUN 9; Creatinine, Ser 0.80; Hemoglobin 12.3; Platelets 222; Potassium 4.1; Sodium 140    Lipid Panel    Component Value Date/Time   CHOL 150 08/02/2024 0454   TRIG 34 08/02/2024 0454   HDL 63 08/02/2024 0454   CHOLHDL 2.4 08/02/2024 0454   VLDL 7 08/02/2024 0454   LDLCALC 80 08/02/2024 0454      Wt Readings from Last 3 Encounters:  09/21/24 132 lb (59.9 kg)  08/01/24 127 lb 13.9 oz (58 kg)      Other studies Reviewed: Additional studies/ records that were reviewed today include: Extensive review of hospital records.  Review of the above records demonstrates:  Please see elsewhere in the note.     ASSESSMENT AND PLAN:   Murmur: She has aortic sclerosis and this is not likely to be contributing to any symptoms and not related to her amaurosis.  No further workup is suggested.  Amaurosis fugax: I think she can come off the Plavix and take aspirin alone.  I do not think she needs an arrhythmia workup.  I do not see any other further cardiac workup that would be helpful as she had an extensive evaluation.  Therapy as above.  Hypertension: Her blood pressure is controlled.  No change in therapy.  Dyslipidemia her LDL was 80 HDL 63.  Continue current therapy.  Current medicines are reviewed at length with the patient today.  The patient has concerns regarding medicines.  The following changes have been made: As above  Labs/ tests ordered today include: None No orders of the defined types were placed in this encounter.    Disposition:   FU with with me as needed.   Signed, Kristi Schilling, MD  09/21/2024 11:15 AM    [facility]

## 2024-09-21 ENCOUNTER — Encounter: Payer: Self-pay | Admitting: Cardiology

## 2024-09-21 ENCOUNTER — Ambulatory Visit: Attending: Cardiology | Admitting: Cardiology

## 2024-09-21 VITALS — BP 132/60 | HR 84 | Ht 62.0 in | Wt 132.0 lb

## 2024-09-21 DIAGNOSIS — R011 Cardiac murmur, unspecified: Secondary | ICD-10-CM

## 2024-09-21 NOTE — Patient Instructions (Signed)
 Medication Instructions:  Your physician has recommended you make the following change in your medication:  1) STOP taking Plavix   *If you need a refill on your cardiac medications before your next appointment, please call your pharmacy*  Follow-Up: At [facility], you and your health needs are our priority.  As part of our continuing mission to provide you with exceptional heart care, our providers are all part of one team.  This team includes your primary Cardiologist (physician) and Advanced Practice Providers or APPs (Physician Assistants and Nurse Practitioners) who all work together to provide you with the care you need, when you need it.  Your next appointment:   As needed with Dr. Lavona

## 2024-09-25 ENCOUNTER — Encounter: Payer: Self-pay | Admitting: Neurology

## 2024-09-25 ENCOUNTER — Ambulatory Visit: Admitting: Neurology

## 2024-09-25 VITALS — BP 145/80 | HR 75 | Ht 62.0 in | Wt 131.0 lb

## 2024-09-25 DIAGNOSIS — G459 Transient cerebral ischemic attack, unspecified: Secondary | ICD-10-CM

## 2024-09-25 NOTE — Patient Instructions (Addendum)
 Continue current medications  Continue to follow up with PCP  Return as needed

## 2024-09-25 NOTE — Progress Notes (Signed)
 [facility]  PATIENT: Kristi Bender DOB: 04/26/1941  REQUESTING CLINICIAN: Jerri Pfeiffer, MD HISTORY FROM: Patient  REASON FOR VISIT: Monocular vision loss    HISTORICAL  CHIEF COMPLAINT:  Chief Complaint  Patient presents with   [id number]    Pt is here Alone. Pt states that she had vision changes in her eye.     HISTORY OF PRESENT ILLNESS:  Discussed the use of AI scribe software for clinical note transcription with the patient, who gave verbal consent to proceed.  Kristi Bender is an 83 year old female with history of hypertension, hyperlipidemia, hypothyroidism, migraines and macular degeneration who presents with transient vision loss in the left eye.  She experienced a sudden loss of vision in the inferior part of her left eye, lasting about five minutes before resolving spontaneously. This episode occurred while she was watching TV and using the computer. The vision loss affected the bottom part of her visual field, while the top part remained visible. The right eye was unaffected.  Following the episode, she went to the emergency room where she underwent two CT scans and an MRI, all of which returned normal results. She was evaluated by an ophthalmologist who found no issues with her eyes. An ultrasound of her carotid artery was also normal.  She was initially placed on antiplatelets, aspirin  and Plavix  for 21 days by a neurologist at the hospital. Her family doctor extended the prescription to 90 days. She stopped taking Plavix  3 days ago after instruction from her cardiologist.  She is currently on aspirin  81 mg daily.  She has a history of migraines, which she experienced from age 27 until late in her life. Her past migraines were associated with visual disturbances such as bright lights and lasted up to three days, requiring her to rest in a dark room. However, the recent vision loss episode was not accompanied by a headache.  She also has macular  degeneration, which affects the center of her vision, but she reports no current problems related to this condition. She has experienced visual phenomena such as flashes of light and the sensation of movement in her peripheral vision. No recent visual disturbances since the episode.   OTHER MEDICAL CONDITIONS: Hypertension, hyperlipidemia, hypothyroidism, history of migraines   REVIEW OF SYSTEMS: Full 14 system review of systems performed and negative with exception of: As noted in the HPI  ALLERGIES: No Known Allergies  HOME MEDICATIONS: Outpatient Medications Prior to Visit  Medication Sig Dispense Refill   aspirin  EC 81 MG tablet Take 1 tablet (81 mg total) by mouth daily. Swallow whole. 30 tablet 2   levothyroxine  (SYNTHROID ) 88 MCG tablet      rosuvastatin  (CRESTOR ) 10 MG tablet Take 1 tablet (10 mg total) by mouth every evening. 30 tablet 2   valsartan (DIOVAN) 40 MG tablet Take 40 mg by mouth every evening.     gabapentin (NEURONTIN) 100 MG capsule Take 100 mg by mouth 2 (two) times daily as needed (for pain). (Patient not taking: Reported on 09/25/2024)     No facility-administered medications prior to visit.    PAST MEDICAL HISTORY: Past Medical History:  Diagnosis Date   Essential hypertension 08/01/2024   H/O total vaginal hysterectomy    Hyperlipidemia 08/01/2024   Hypothyroidism 08/01/2024    PAST SURGICAL HISTORY: Past Surgical History:  Procedure Laterality Date   hysterectomy      FAMILY HISTORY: Family History  Problem Relation Age of Onset   Heart attack  Mother 59   Cancer Father        Esophagus    SOCIAL HISTORY: Social History   Socioeconomic History   Marital status: Divorced    Spouse name: Not on file   Number of children: Not on file   Years of education: Not on file   Highest education level: Not on file  Occupational History   Not on file  Tobacco Use   Smoking status: Never   Smokeless tobacco: Never  Substance and Sexual  Activity   Alcohol  use: Not Currently   Drug use: Not on file   Sexual activity: Not on file  Other Topics Concern   Not on file  Social History Narrative   Lives alone. One daughter and one son.  Divorced   Social Drivers of Corporate Investment Banker Strain: Not on file  Food Insecurity: No Food Insecurity (08/01/2024)   Hunger Vital Sign    Worried About Running Out of Food in the Last Year: Never true    Ran Out of Food in the Last Year: Never true  Transportation Needs: No Transportation Needs (08/01/2024)   PRAPARE - Administrator, Civil Service (Medical): No    Lack of Transportation (Non-Medical): No  Physical Activity: Not on file  Stress: Not on file  Social Connections: Moderately Isolated (08/01/2024)   Social Connection and Isolation Panel    Frequency of Communication with Friends and Family: More than three times a week    Frequency of Social Gatherings with Friends and Family: Three times a week    Attends Religious Services: More than 4 times per year    Active Member of Clubs or Organizations: No    Attends Banker Meetings: Never    Marital Status: Divorced  Catering Manager Violence: Not At Risk (08/01/2024)   Humiliation, Afraid, Rape, and Kick questionnaire    Fear of Current or Ex-Partner: No    Emotionally Abused: No    Physically Abused: No    Sexually Abused: No    PHYSICAL EXAM  GENERAL EXAM/CONSTITUTIONAL: Vitals:  Vitals:   09/25/24 1347  BP: (!) 145/80  Pulse: 75  SpO2: 98%  Weight: 131 lb (59.4 kg)  Height: 5' 2 (1.575 m)   Body mass index is 23.96 kg/m. Wt Readings from Last 3 Encounters:  09/25/24 131 lb (59.4 kg)  09/21/24 132 lb (59.9 kg)  08/01/24 127 lb 13.9 oz (58 kg)   Patient is in no distress; well developed, nourished and groomed; neck is supple  MUSCULOSKELETAL: Gait, strength, tone, movements noted in Neurologic exam below  NEUROLOGIC: MENTAL STATUS:      No data to display          awake, alert, oriented to person, place and time recent and remote memory intact normal attention and concentration language fluent, comprehension intact, naming intact fund of knowledge appropriate  CRANIAL NERVE:  2nd, 3rd, 4th, 6th - Visual fields full to confrontation, extraocular muscles intact, no nystagmus 5th - facial sensation symmetric 7th - facial strength symmetric 8th - hearing intact 9th - palate elevates symmetrically, uvula midline 11th - shoulder shrug symmetric 12th - tongue protrusion midline  MOTOR:  normal bulk and tone, full strength in the BUE, BLE  SENSORY:  normal and symmetric to light touch  COORDINATION:  finger-nose-finger, fine finger movements normal  GAIT/STATION:  normal  DIAGNOSTIC DATA (LABS, IMAGING, TESTING) - I reviewed patient records, labs, notes, testing and imaging myself where available.  Lab Results  Component Value Date   WBC 5.9 08/02/2024   HGB 12.3 08/02/2024   HCT 35.5 (L) 08/02/2024   MCV 95.7 08/02/2024   PLT 222 08/02/2024      Component Value Date/Time   NA 140 08/02/2024 0454   K 4.1 08/02/2024 0454   CL 104 08/02/2024 0454   CO2 26 08/02/2024 0454   GLUCOSE 95 08/02/2024 0454   BUN 9 08/02/2024 0454   CREATININE 0.80 08/02/2024 0454   CALCIUM  9.3 08/02/2024 0454   PROT 6.7 08/01/2024 1416   ALBUMIN 4.3 08/01/2024 1416   AST 22 08/01/2024 1416   ALT 18 08/01/2024 1416   ALKPHOS 77 08/01/2024 1416   BILITOT 0.5 08/01/2024 1416   GFRNONAA >60 08/02/2024 0454   Lab Results  Component Value Date   CHOL 150 08/02/2024   HDL 63 08/02/2024   LDLCALC 80 08/02/2024   TRIG 34 08/02/2024   CHOLHDL 2.4 08/02/2024   Lab Results  Component Value Date   HGBA1C 5.4 08/02/2024   No results found for: VITAMINB12 No results found for: TSH  MRI Brain 08/02/2024 Normal brain MRI for age. No acute intracranial abnormality identified.  CTA Head and Neck 08/01/2024 1. No large vessel occlusion,  hemodynamically significant stenosis, or aneurysm in the head or neck   ASSESSMENT AND PLAN  83 y.o. year old female with   Transient monocular vision loss of the left eye (Amaurosis fugax) versus ocular migraine Experienced transient monocular vision loss in the left eye, lasting about five minutes, with inferior vision loss. Extensive workup including CT, MRI, and carotid ultrasound were normal. Differential diagnosis includes amaurosis fugax and ocular migraine. Neurological deficit suggests a negative phenomenon, more consistent with TIA.  - Continue daily Aspirin  81 mg.  Easy bruising secondary to antiplatelet therapy Reports easy bruising, particularly in areas with spider veins, likely due to antiplatelet therapy with aspirin  and Plavix . Completed 21-day course of Plavix  as per hospital recommendation. Continued aspirin  therapy is deemed sufficient given the normal workup and low risk of stroke. - Continue daily Aspirin .     1. TIA (transient ischemic attack)     Patient Instructions  Continue current medications  Continue to follow up with PCP  Return as needed   No orders of the defined types were placed in this encounter.   No orders of the defined types were placed in this encounter.   Return if symptoms worsen or fail to improve.    Pastor Falling, MD 09/25/2024, 2:09 PM  [facility] [address], [address] [place], [place] [address] [phone]

## 2024-09-26 ENCOUNTER — Ambulatory Visit: Admitting: Cardiology

## 2024-10-02 DIAGNOSIS — M170 Bilateral primary osteoarthritis of knee: Secondary | ICD-10-CM | POA: Diagnosis not present

## 2024-10-09 DIAGNOSIS — M170 Bilateral primary osteoarthritis of knee: Secondary | ICD-10-CM | POA: Diagnosis not present

## 2024-10-12 DIAGNOSIS — R5383 Other fatigue: Secondary | ICD-10-CM | POA: Diagnosis not present
